# Patient Record
Sex: Male | Born: 1945 | Race: White | Hispanic: No | Marital: Married | Smoking: Never smoker
Health system: Southern US, Community
[De-identification: ages and names within clinical notes are randomized; demographics above are authoritative.]

## PROBLEM LIST (undated history)

## (undated) DIAGNOSIS — I10 Essential (primary) hypertension: Secondary | ICD-10-CM

---

## 2009-12-13 ENCOUNTER — Inpatient Hospital Stay: Payer: Self-pay | Admitting: Internal Medicine

## 2012-01-14 ENCOUNTER — Ambulatory Visit: Payer: Self-pay | Admitting: Surgery

## 2012-01-14 LAB — CBC WITH DIFFERENTIAL/PLATELET
Basophil #: 0.1 10*3/uL (ref 0.0–0.1)
Basophil %: 0.9 %
Eosinophil #: 0.2 10*3/uL (ref 0.0–0.7)
Eosinophil %: 2.8 %
HCT: 44.7 % (ref 40.0–52.0)
Lymphocyte #: 3.3 10*3/uL (ref 1.0–3.6)
Lymphocyte %: 43.4 %
MCHC: 34.7 g/dL (ref 32.0–36.0)
Monocyte %: 7.9 %
Neutrophil #: 3.4 10*3/uL (ref 1.4–6.5)
Platelet: 166 10*3/uL (ref 150–440)
RDW: 13.5 % (ref 11.5–14.5)
WBC: 7.5 10*3/uL (ref 3.8–10.6)

## 2012-01-14 LAB — BASIC METABOLIC PANEL
Chloride: 97 mmol/L — ABNORMAL LOW (ref 98–107)
Co2: 33 mmol/L — ABNORMAL HIGH (ref 21–32)
Creatinine: 1.35 mg/dL — ABNORMAL HIGH (ref 0.60–1.30)
EGFR (African American): >60
Potassium: 3.4 mmol/L — ABNORMAL LOW (ref 3.5–5.1)
Sodium: 137 mmol/L (ref 136–145)

## 2012-01-21 ENCOUNTER — Ambulatory Visit: Payer: Self-pay | Admitting: Surgery

## 2012-05-14 ENCOUNTER — Ambulatory Visit: Payer: Self-pay | Admitting: Orthopedic Surgery

## 2012-07-23 ENCOUNTER — Ambulatory Visit: Payer: Self-pay | Admitting: Surgery

## 2012-07-23 LAB — BASIC METABOLIC PANEL
Anion Gap: 4 — ABNORMAL LOW (ref 7–16)
BUN: 12 mg/dL (ref 7–18)
Calcium, Total: 9.1 mg/dL (ref 8.5–10.1)
EGFR (African American): >60
EGFR (Non-African Amer.): >60
Glucose: 153 mg/dL — ABNORMAL HIGH (ref 65–99)
Osmolality: 275 (ref 275–301)
Potassium: 3.2 mmol/L — ABNORMAL LOW (ref 3.5–5.1)
Sodium: 136 mmol/L (ref 136–145)

## 2012-07-23 LAB — CBC WITH DIFFERENTIAL/PLATELET
Basophil %: 0.7 %
Eosinophil #: 0.3 10*3/uL (ref 0.0–0.7)
HCT: 41.1 % (ref 40.0–52.0)
HGB: 14.2 g/dL (ref 13.0–18.0)
Lymphocyte #: 2.7 10*3/uL (ref 1.0–3.6)
MCH: 31.5 pg (ref 26.0–34.0)
MCHC: 34.5 g/dL (ref 32.0–36.0)
MCV: 91 fL (ref 80–100)
Monocyte #: 0.7 x10 3/mm (ref 0.2–1.0)
Monocyte %: 7.9 %
Neutrophil #: 5.5 10*3/uL (ref 1.4–6.5)
Platelet: 185 10*3/uL (ref 150–440)
RDW: 13.5 % (ref 11.5–14.5)

## 2012-07-30 ENCOUNTER — Ambulatory Visit: Payer: Self-pay | Admitting: Surgery

## 2013-07-20 DIAGNOSIS — H524 Presbyopia: Secondary | ICD-10-CM | POA: Diagnosis not present

## 2013-07-20 DIAGNOSIS — D3130 Benign neoplasm of unspecified choroid: Secondary | ICD-10-CM | POA: Diagnosis not present

## 2013-07-20 DIAGNOSIS — H35039 Hypertensive retinopathy, unspecified eye: Secondary | ICD-10-CM | POA: Diagnosis not present

## 2013-08-10 DIAGNOSIS — R002 Palpitations: Secondary | ICD-10-CM | POA: Diagnosis not present

## 2013-08-10 DIAGNOSIS — R0682 Tachypnea, not elsewhere classified: Secondary | ICD-10-CM | POA: Diagnosis not present

## 2013-09-23 DIAGNOSIS — E785 Hyperlipidemia, unspecified: Secondary | ICD-10-CM | POA: Diagnosis not present

## 2013-09-30 DIAGNOSIS — F329 Major depressive disorder, single episode, unspecified: Secondary | ICD-10-CM | POA: Diagnosis not present

## 2013-09-30 DIAGNOSIS — L719 Rosacea, unspecified: Secondary | ICD-10-CM | POA: Diagnosis not present

## 2013-09-30 DIAGNOSIS — I10 Essential (primary) hypertension: Secondary | ICD-10-CM | POA: Diagnosis not present

## 2013-09-30 DIAGNOSIS — E785 Hyperlipidemia, unspecified: Secondary | ICD-10-CM | POA: Diagnosis not present

## 2013-09-30 DIAGNOSIS — F3289 Other specified depressive episodes: Secondary | ICD-10-CM | POA: Diagnosis not present

## 2014-04-09 DIAGNOSIS — I10 Essential (primary) hypertension: Secondary | ICD-10-CM | POA: Diagnosis not present

## 2014-04-09 DIAGNOSIS — R05 Cough: Secondary | ICD-10-CM | POA: Diagnosis not present

## 2014-04-09 DIAGNOSIS — R42 Dizziness and giddiness: Secondary | ICD-10-CM | POA: Diagnosis not present

## 2014-04-09 DIAGNOSIS — E785 Hyperlipidemia, unspecified: Secondary | ICD-10-CM | POA: Diagnosis not present

## 2014-04-12 ENCOUNTER — Ambulatory Visit: Payer: Self-pay

## 2014-04-12 DIAGNOSIS — I6523 Occlusion and stenosis of bilateral carotid arteries: Secondary | ICD-10-CM | POA: Diagnosis not present

## 2014-04-12 DIAGNOSIS — R42 Dizziness and giddiness: Secondary | ICD-10-CM | POA: Diagnosis not present

## 2014-07-14 DIAGNOSIS — H35033 Hypertensive retinopathy, bilateral: Secondary | ICD-10-CM | POA: Diagnosis not present

## 2014-07-14 DIAGNOSIS — H2513 Age-related nuclear cataract, bilateral: Secondary | ICD-10-CM | POA: Diagnosis not present

## 2014-07-14 DIAGNOSIS — H524 Presbyopia: Secondary | ICD-10-CM | POA: Diagnosis not present

## 2014-08-12 DIAGNOSIS — Z8679 Personal history of other diseases of the circulatory system: Secondary | ICD-10-CM | POA: Diagnosis not present

## 2014-08-12 DIAGNOSIS — E785 Hyperlipidemia, unspecified: Secondary | ICD-10-CM | POA: Diagnosis not present

## 2014-08-12 DIAGNOSIS — F329 Major depressive disorder, single episode, unspecified: Secondary | ICD-10-CM | POA: Diagnosis not present

## 2014-08-12 DIAGNOSIS — I10 Essential (primary) hypertension: Secondary | ICD-10-CM | POA: Diagnosis not present

## 2014-08-20 NOTE — Op Note (Signed)
PATIENT NAME:  Anthony Estes, DESA MR#:  [id number] DATE OF BIRTH:  [birth year]  DATE OF OPERATION:  07/30/2012  PREOPERATIVE DIAGNOSIS: Recurrent right inguinal hernia.   POSTOPERATIVE DIAGNOSIS: Recurrent right inguinal hernia.   PROCEDURE: Laparoscopic transabdominal repair of a recurrent right inguinal hernia.   SURGEON: Phoebe Perch, MD  ASSISTANT: Addison Lank, PA-S.  INDICATIONS: This is a patient with a history of bilateral inguinal hernia repaired via TEP method at the [facility] last year. He had a recurrence on the left, which was repaired transabdominally by me, and now he has a recurrence on the right, with pain. No bulge.   Preoperatively, we discussed the rationale for surgery, the options of observation, the rationale for the transabdominal approach and the risks of bleeding, infection, recurrence, bowel injury and adhesions. This was all reviewed for him in the preop holding area again with his wife. They understood and agreed to proceed.   FINDINGS: Extensive adhesions on the left. Minimal adhesions around the periumbilical area. No bowel involved in the periumbilical adhesions. Some adhesions on the right lower quadrant not related to the groin, more related to the cecum. The appendix was identified and appeared completely normal on the right.   On the right there was no visible inguinal hernia recurrence until the peritoneum was taken down. Then it was evident that the mesh had migrated from medial to lateral, exposing the direct space and a small direct space hernia was noted there, with incarcerated preperitoneal fat. This was a very similar, if not identical, condition to what I identified on the left side last year at his previous operation.   DESCRIPTION OF PROCEDURE: The patient was induced to general anesthesia, given IV antibiotics. A Foley catheter was placed and VTE prophylaxis was in place. He was prepped and draped in a sterile fashion. Marcaine was infiltrated in  the skin and subcutaneous tissues around the periumbilical cord. An incision was made. A Veress needle was placed. Pneumoperitoneum was obtained and a 5 mm trocar port was placed. The abdominal cavity was explored. Under direct vision a suprapubic 5 mm port was placed and then under direct vision after identifying the adhesions, the 12 mm left lateral port was placed under direct vision, avoiding adhesions.   Adhesiolysis was performed, both in the right lower quadrant and in the periumbilical area, with sharp dissection with electrocautery. No bowel was identified within either of these adhesions. However, in the right lower quadrant the cecum was nearby. The appendix was identified at this time as well and found to be normal.   With adhesiolysis complete, attention was turned to the right lower quadrant where the previous tacks and mesh were identified in the preperitoneal space. The peritoneum was taken down around this sharply, and dissection was performed medially to identify Dvonte Gatliff's ligament, and the recurrence became evident, and it was clear that the mesh had mediated and folded medial to lateral. Dissection around the cord was performed and laterally as well.   A 5 x 5 C-Qur mesh was chosen as this was the mesh utilized previously on the recurrent left side. It was laterally scissored and placed into the preperitoneal space transabdominally, held in place with the [place] Surgical tacker, Jarrah Seher's ligament medially, and to the anterior abdominal wall. The tails of the scissored mesh were placed over and under the cord and held laterally with a single tack, avoiding the area of the nerve.   The re-peritonealization was attempted. The caudad to cephalad lifting of the previously  dissected peritoneum and bladder flap was performed to cover a large portion of the mesh, however a large portion of the C-Qur mesh was not covered and there was no evidence of  bowel coming into contact with this at the time of  closure, however there were adhesions already present which were noted in the pelvis to the peritoneum below the area of dissection.   The patient was taken out of Trendelenburg position and the re-peritonealization seemed to hold its position. The left lateral port site was closed utilizing an EndoClose technique with 0 Vicryl simple sutures, and then again hemostasis was found to be adequate. The hernia repair appeared to be adequate, therefore pneumoperitoneum was released. All ports were removed; 4-0 subcuticular Monocryl was used on all skin edges. Steri-Strips, Mastisol, and sterile dressings were placed.   The patient tolerated the procedure well. There were no complications. He was taken to the recovery room in stable condition to be discharged in the care of his family. He will follow up in 10 days.   ____________________________ Jerrol Banana Burt Knack, MD rec:dm D: 07/30/2012 08:42:00 ET T: 07/30/2012 08:51:38 ET JOB#: [id number]  cc: Jerrol Banana. Burt Knack, MD, <Dictator> Florene Glen MD ELECTRONICALLY SIGNED 07/30/2012 15:05

## 2015-02-28 DIAGNOSIS — M722 Plantar fascial fibromatosis: Secondary | ICD-10-CM | POA: Diagnosis not present

## 2015-04-01 DIAGNOSIS — K649 Unspecified hemorrhoids: Secondary | ICD-10-CM | POA: Diagnosis not present

## 2015-04-29 DIAGNOSIS — L290 Pruritus ani: Secondary | ICD-10-CM | POA: Diagnosis not present

## 2015-04-29 DIAGNOSIS — K648 Other hemorrhoids: Secondary | ICD-10-CM | POA: Diagnosis not present

## 2015-06-08 DIAGNOSIS — K648 Other hemorrhoids: Secondary | ICD-10-CM | POA: Diagnosis not present

## 2015-07-06 DIAGNOSIS — K648 Other hemorrhoids: Secondary | ICD-10-CM | POA: Diagnosis not present

## 2015-07-12 DIAGNOSIS — H00025 Hordeolum internum left lower eyelid: Secondary | ICD-10-CM | POA: Diagnosis not present

## 2015-07-12 DIAGNOSIS — H35033 Hypertensive retinopathy, bilateral: Secondary | ICD-10-CM | POA: Diagnosis not present

## 2015-07-12 DIAGNOSIS — H524 Presbyopia: Secondary | ICD-10-CM | POA: Diagnosis not present

## 2015-07-12 DIAGNOSIS — H00022 Hordeolum internum right lower eyelid: Secondary | ICD-10-CM | POA: Diagnosis not present

## 2015-07-12 DIAGNOSIS — H2513 Age-related nuclear cataract, bilateral: Secondary | ICD-10-CM | POA: Diagnosis not present

## 2015-07-12 DIAGNOSIS — I10 Essential (primary) hypertension: Secondary | ICD-10-CM | POA: Diagnosis not present

## 2015-08-16 DIAGNOSIS — M1990 Unspecified osteoarthritis, unspecified site: Secondary | ICD-10-CM | POA: Diagnosis not present

## 2015-08-16 DIAGNOSIS — F4310 Post-traumatic stress disorder, unspecified: Secondary | ICD-10-CM | POA: Diagnosis not present

## 2015-08-16 DIAGNOSIS — I10 Essential (primary) hypertension: Secondary | ICD-10-CM | POA: Diagnosis not present

## 2015-08-16 DIAGNOSIS — E784 Other hyperlipidemia: Secondary | ICD-10-CM | POA: Diagnosis not present

## 2015-08-19 DIAGNOSIS — L821 Other seborrheic keratosis: Secondary | ICD-10-CM | POA: Diagnosis not present

## 2015-08-19 DIAGNOSIS — L301 Dyshidrosis [pompholyx]: Secondary | ICD-10-CM | POA: Diagnosis not present

## 2015-12-30 DIAGNOSIS — L298 Other pruritus: Secondary | ICD-10-CM | POA: Diagnosis not present

## 2015-12-30 DIAGNOSIS — L821 Other seborrheic keratosis: Secondary | ICD-10-CM | POA: Diagnosis not present

## 2015-12-30 DIAGNOSIS — L820 Inflamed seborrheic keratosis: Secondary | ICD-10-CM | POA: Diagnosis not present

## 2015-12-30 DIAGNOSIS — L538 Other specified erythematous conditions: Secondary | ICD-10-CM | POA: Diagnosis not present

## 2016-07-11 DIAGNOSIS — H01025 Squamous blepharitis left lower eyelid: Secondary | ICD-10-CM | POA: Diagnosis not present

## 2016-07-11 DIAGNOSIS — H35033 Hypertensive retinopathy, bilateral: Secondary | ICD-10-CM | POA: Diagnosis not present

## 2016-07-11 DIAGNOSIS — H01022 Squamous blepharitis right lower eyelid: Secondary | ICD-10-CM | POA: Diagnosis not present

## 2016-07-11 DIAGNOSIS — H524 Presbyopia: Secondary | ICD-10-CM | POA: Diagnosis not present

## 2016-07-11 DIAGNOSIS — I10 Essential (primary) hypertension: Secondary | ICD-10-CM | POA: Diagnosis not present

## 2016-07-11 DIAGNOSIS — H2513 Age-related nuclear cataract, bilateral: Secondary | ICD-10-CM | POA: Diagnosis not present

## 2016-08-01 DIAGNOSIS — L570 Actinic keratosis: Secondary | ICD-10-CM | POA: Diagnosis not present

## 2016-08-01 DIAGNOSIS — L853 Xerosis cutis: Secondary | ICD-10-CM | POA: Diagnosis not present

## 2016-08-01 DIAGNOSIS — L300 Nummular dermatitis: Secondary | ICD-10-CM | POA: Diagnosis not present

## 2016-08-01 DIAGNOSIS — R202 Paresthesia of skin: Secondary | ICD-10-CM | POA: Diagnosis not present

## 2016-08-01 DIAGNOSIS — L821 Other seborrheic keratosis: Secondary | ICD-10-CM | POA: Diagnosis not present

## 2016-08-01 DIAGNOSIS — X32XXXA Exposure to sunlight, initial encounter: Secondary | ICD-10-CM | POA: Diagnosis not present

## 2017-07-18 DIAGNOSIS — H524 Presbyopia: Secondary | ICD-10-CM | POA: Diagnosis not present

## 2017-07-18 DIAGNOSIS — H2513 Age-related nuclear cataract, bilateral: Secondary | ICD-10-CM | POA: Diagnosis not present

## 2017-07-18 DIAGNOSIS — H52223 Regular astigmatism, bilateral: Secondary | ICD-10-CM | POA: Diagnosis not present

## 2017-07-18 DIAGNOSIS — H5203 Hypermetropia, bilateral: Secondary | ICD-10-CM | POA: Diagnosis not present

## 2017-07-31 DIAGNOSIS — X32XXXA Exposure to sunlight, initial encounter: Secondary | ICD-10-CM | POA: Diagnosis not present

## 2017-07-31 DIAGNOSIS — D0439 Carcinoma in situ of skin of other parts of face: Secondary | ICD-10-CM | POA: Diagnosis not present

## 2017-07-31 DIAGNOSIS — L570 Actinic keratosis: Secondary | ICD-10-CM | POA: Diagnosis not present

## 2017-07-31 DIAGNOSIS — D485 Neoplasm of uncertain behavior of skin: Secondary | ICD-10-CM | POA: Diagnosis not present

## 2017-08-22 DIAGNOSIS — D0439 Carcinoma in situ of skin of other parts of face: Secondary | ICD-10-CM | POA: Diagnosis not present

## 2017-08-22 DIAGNOSIS — L905 Scar conditions and fibrosis of skin: Secondary | ICD-10-CM | POA: Diagnosis not present

## 2017-10-12 DIAGNOSIS — M9903 Segmental and somatic dysfunction of lumbar region: Secondary | ICD-10-CM | POA: Diagnosis not present

## 2017-10-12 DIAGNOSIS — M4306 Spondylolysis, lumbar region: Secondary | ICD-10-CM | POA: Diagnosis not present

## 2017-10-12 DIAGNOSIS — M955 Acquired deformity of pelvis: Secondary | ICD-10-CM | POA: Diagnosis not present

## 2017-10-12 DIAGNOSIS — M9905 Segmental and somatic dysfunction of pelvic region: Secondary | ICD-10-CM | POA: Diagnosis not present

## 2017-10-15 DIAGNOSIS — M9905 Segmental and somatic dysfunction of pelvic region: Secondary | ICD-10-CM | POA: Diagnosis not present

## 2017-10-15 DIAGNOSIS — M9903 Segmental and somatic dysfunction of lumbar region: Secondary | ICD-10-CM | POA: Diagnosis not present

## 2017-10-15 DIAGNOSIS — M955 Acquired deformity of pelvis: Secondary | ICD-10-CM | POA: Diagnosis not present

## 2017-10-15 DIAGNOSIS — M4306 Spondylolysis, lumbar region: Secondary | ICD-10-CM | POA: Diagnosis not present

## 2017-10-16 DIAGNOSIS — M4306 Spondylolysis, lumbar region: Secondary | ICD-10-CM | POA: Diagnosis not present

## 2017-10-16 DIAGNOSIS — M9903 Segmental and somatic dysfunction of lumbar region: Secondary | ICD-10-CM | POA: Diagnosis not present

## 2017-10-16 DIAGNOSIS — M955 Acquired deformity of pelvis: Secondary | ICD-10-CM | POA: Diagnosis not present

## 2017-10-16 DIAGNOSIS — M9905 Segmental and somatic dysfunction of pelvic region: Secondary | ICD-10-CM | POA: Diagnosis not present

## 2017-10-17 DIAGNOSIS — M4306 Spondylolysis, lumbar region: Secondary | ICD-10-CM | POA: Diagnosis not present

## 2017-10-17 DIAGNOSIS — M9905 Segmental and somatic dysfunction of pelvic region: Secondary | ICD-10-CM | POA: Diagnosis not present

## 2017-10-17 DIAGNOSIS — M9903 Segmental and somatic dysfunction of lumbar region: Secondary | ICD-10-CM | POA: Diagnosis not present

## 2017-10-17 DIAGNOSIS — M955 Acquired deformity of pelvis: Secondary | ICD-10-CM | POA: Diagnosis not present

## 2017-10-21 DIAGNOSIS — M9905 Segmental and somatic dysfunction of pelvic region: Secondary | ICD-10-CM | POA: Diagnosis not present

## 2017-10-21 DIAGNOSIS — M4306 Spondylolysis, lumbar region: Secondary | ICD-10-CM | POA: Diagnosis not present

## 2017-10-21 DIAGNOSIS — M9903 Segmental and somatic dysfunction of lumbar region: Secondary | ICD-10-CM | POA: Diagnosis not present

## 2017-10-21 DIAGNOSIS — M955 Acquired deformity of pelvis: Secondary | ICD-10-CM | POA: Diagnosis not present

## 2017-10-23 DIAGNOSIS — M9903 Segmental and somatic dysfunction of lumbar region: Secondary | ICD-10-CM | POA: Diagnosis not present

## 2017-10-23 DIAGNOSIS — M955 Acquired deformity of pelvis: Secondary | ICD-10-CM | POA: Diagnosis not present

## 2017-10-23 DIAGNOSIS — M4306 Spondylolysis, lumbar region: Secondary | ICD-10-CM | POA: Diagnosis not present

## 2017-10-23 DIAGNOSIS — M9905 Segmental and somatic dysfunction of pelvic region: Secondary | ICD-10-CM | POA: Diagnosis not present

## 2017-10-24 DIAGNOSIS — L290 Pruritus ani: Secondary | ICD-10-CM | POA: Diagnosis not present

## 2017-10-24 DIAGNOSIS — M9905 Segmental and somatic dysfunction of pelvic region: Secondary | ICD-10-CM | POA: Diagnosis not present

## 2017-10-24 DIAGNOSIS — M4306 Spondylolysis, lumbar region: Secondary | ICD-10-CM | POA: Diagnosis not present

## 2017-10-24 DIAGNOSIS — M9903 Segmental and somatic dysfunction of lumbar region: Secondary | ICD-10-CM | POA: Diagnosis not present

## 2017-10-24 DIAGNOSIS — M955 Acquired deformity of pelvis: Secondary | ICD-10-CM | POA: Diagnosis not present

## 2017-10-28 DIAGNOSIS — M955 Acquired deformity of pelvis: Secondary | ICD-10-CM | POA: Diagnosis not present

## 2017-10-28 DIAGNOSIS — M9903 Segmental and somatic dysfunction of lumbar region: Secondary | ICD-10-CM | POA: Diagnosis not present

## 2017-10-28 DIAGNOSIS — M4306 Spondylolysis, lumbar region: Secondary | ICD-10-CM | POA: Diagnosis not present

## 2017-10-28 DIAGNOSIS — M9905 Segmental and somatic dysfunction of pelvic region: Secondary | ICD-10-CM | POA: Diagnosis not present

## 2017-11-04 DIAGNOSIS — M4306 Spondylolysis, lumbar region: Secondary | ICD-10-CM | POA: Diagnosis not present

## 2017-11-04 DIAGNOSIS — M9905 Segmental and somatic dysfunction of pelvic region: Secondary | ICD-10-CM | POA: Diagnosis not present

## 2017-11-04 DIAGNOSIS — M955 Acquired deformity of pelvis: Secondary | ICD-10-CM | POA: Diagnosis not present

## 2017-11-04 DIAGNOSIS — M9903 Segmental and somatic dysfunction of lumbar region: Secondary | ICD-10-CM | POA: Diagnosis not present

## 2017-11-11 DIAGNOSIS — M4306 Spondylolysis, lumbar region: Secondary | ICD-10-CM | POA: Diagnosis not present

## 2017-11-11 DIAGNOSIS — M9903 Segmental and somatic dysfunction of lumbar region: Secondary | ICD-10-CM | POA: Diagnosis not present

## 2017-11-11 DIAGNOSIS — M9905 Segmental and somatic dysfunction of pelvic region: Secondary | ICD-10-CM | POA: Diagnosis not present

## 2017-11-11 DIAGNOSIS — M955 Acquired deformity of pelvis: Secondary | ICD-10-CM | POA: Diagnosis not present

## 2017-11-21 DIAGNOSIS — L290 Pruritus ani: Secondary | ICD-10-CM | POA: Diagnosis not present

## 2017-11-25 DIAGNOSIS — M955 Acquired deformity of pelvis: Secondary | ICD-10-CM | POA: Diagnosis not present

## 2017-11-25 DIAGNOSIS — M4306 Spondylolysis, lumbar region: Secondary | ICD-10-CM | POA: Diagnosis not present

## 2017-11-25 DIAGNOSIS — M9905 Segmental and somatic dysfunction of pelvic region: Secondary | ICD-10-CM | POA: Diagnosis not present

## 2017-11-25 DIAGNOSIS — M9903 Segmental and somatic dysfunction of lumbar region: Secondary | ICD-10-CM | POA: Diagnosis not present

## 2017-12-11 DIAGNOSIS — X32XXXA Exposure to sunlight, initial encounter: Secondary | ICD-10-CM | POA: Diagnosis not present

## 2017-12-11 DIAGNOSIS — L821 Other seborrheic keratosis: Secondary | ICD-10-CM | POA: Diagnosis not present

## 2017-12-11 DIAGNOSIS — L570 Actinic keratosis: Secondary | ICD-10-CM | POA: Diagnosis not present

## 2017-12-11 DIAGNOSIS — Z85828 Personal history of other malignant neoplasm of skin: Secondary | ICD-10-CM | POA: Diagnosis not present

## 2017-12-11 DIAGNOSIS — Z08 Encounter for follow-up examination after completed treatment for malignant neoplasm: Secondary | ICD-10-CM | POA: Diagnosis not present

## 2017-12-31 DIAGNOSIS — M955 Acquired deformity of pelvis: Secondary | ICD-10-CM | POA: Diagnosis not present

## 2017-12-31 DIAGNOSIS — M9905 Segmental and somatic dysfunction of pelvic region: Secondary | ICD-10-CM | POA: Diagnosis not present

## 2017-12-31 DIAGNOSIS — M4306 Spondylolysis, lumbar region: Secondary | ICD-10-CM | POA: Diagnosis not present

## 2017-12-31 DIAGNOSIS — M9903 Segmental and somatic dysfunction of lumbar region: Secondary | ICD-10-CM | POA: Diagnosis not present

## 2018-02-25 DIAGNOSIS — M9903 Segmental and somatic dysfunction of lumbar region: Secondary | ICD-10-CM | POA: Diagnosis not present

## 2018-02-25 DIAGNOSIS — M9905 Segmental and somatic dysfunction of pelvic region: Secondary | ICD-10-CM | POA: Diagnosis not present

## 2018-02-25 DIAGNOSIS — M4306 Spondylolysis, lumbar region: Secondary | ICD-10-CM | POA: Diagnosis not present

## 2018-02-25 DIAGNOSIS — M955 Acquired deformity of pelvis: Secondary | ICD-10-CM | POA: Diagnosis not present

## 2019-01-13 ENCOUNTER — Other Ambulatory Visit: Payer: Self-pay | Admitting: Infectious Diseases

## 2019-01-13 DIAGNOSIS — E7849 Other hyperlipidemia: Secondary | ICD-10-CM

## 2019-01-13 DIAGNOSIS — N39490 Overflow incontinence: Secondary | ICD-10-CM

## 2019-01-13 DIAGNOSIS — M545 Low back pain, unspecified: Secondary | ICD-10-CM

## 2019-01-26 ENCOUNTER — Other Ambulatory Visit: Payer: Self-pay

## 2019-01-26 ENCOUNTER — Ambulatory Visit
Admission: RE | Admit: 2019-01-26 | Discharge: 2019-01-26 | Disposition: A | Discharge status: Home / Self Care | Payer: Medicare Other | Source: Ambulatory Visit | Attending: Infectious Diseases | Admitting: Infectious Diseases

## 2019-01-26 DIAGNOSIS — N39490 Overflow incontinence: Secondary | ICD-10-CM | POA: Diagnosis present

## 2019-01-26 DIAGNOSIS — M545 Low back pain, unspecified: Secondary | ICD-10-CM

## 2019-01-26 DIAGNOSIS — E7849 Other hyperlipidemia: Secondary | ICD-10-CM | POA: Insufficient documentation

## 2019-01-26 MED ORDER — GADOBUTROL 1 MMOL/ML IV SOLN
6.0000 mL | Freq: Once | INTRAVENOUS | Status: AC | PRN
  Administered 2019-01-26: 16:00:00 6 mL via INTRAVENOUS

## 2019-09-02 ENCOUNTER — Observation Stay
Admission: EM | Admit: 2019-09-02 | Discharge: 2019-09-03 | Disposition: A | Discharge status: Home / Self Care | Payer: Medicare Other | Attending: Internal Medicine | Admitting: Internal Medicine

## 2019-09-02 ENCOUNTER — Encounter: Payer: Self-pay | Admitting: Emergency Medicine

## 2019-09-02 ENCOUNTER — Other Ambulatory Visit: Payer: Self-pay

## 2019-09-02 ENCOUNTER — Emergency Department: Payer: Medicare Other

## 2019-09-02 DIAGNOSIS — T797XXA Traumatic subcutaneous emphysema, initial encounter: Principal | ICD-10-CM

## 2019-09-02 DIAGNOSIS — Z79899 Other long term (current) drug therapy: Secondary | ICD-10-CM | POA: Insufficient documentation

## 2019-09-02 DIAGNOSIS — E876 Hypokalemia: Secondary | ICD-10-CM | POA: Insufficient documentation

## 2019-09-02 DIAGNOSIS — I10 Essential (primary) hypertension: Secondary | ICD-10-CM | POA: Insufficient documentation

## 2019-09-02 DIAGNOSIS — T8182XA Emphysema (subcutaneous) resulting from a procedure, initial encounter: Secondary | ICD-10-CM | POA: Insufficient documentation

## 2019-09-02 DIAGNOSIS — Z20822 Contact with and (suspected) exposure to covid-19: Secondary | ICD-10-CM | POA: Diagnosis not present

## 2019-09-02 DIAGNOSIS — Y838 Other surgical procedures as the cause of abnormal reaction of the patient, or of later complication, without mention of misadventure at the time of the procedure: Secondary | ICD-10-CM | POA: Diagnosis not present

## 2019-09-02 DIAGNOSIS — R220 Localized swelling, mass and lump, head: Secondary | ICD-10-CM

## 2019-09-02 HISTORY — DX: Essential (primary) hypertension: I10

## 2019-09-02 LAB — CBC WITH DIFFERENTIAL/PLATELET
Abs Immature Granulocytes: 0.02 10*3/uL (ref 0.00–0.07)
Basophils Absolute: 0.1 10*3/uL (ref 0.0–0.1)
Basophils Relative: 1 %
Eosinophils Absolute: 0.3 10*3/uL (ref 0.0–0.5)
Eosinophils Relative: 4 %
HCT: 42.3 % (ref 39.0–52.0)
Hemoglobin: 14.9 g/dL (ref 13.0–17.0)
Immature Granulocytes: 0 %
Lymphocytes Relative: 51 %
Lymphs Abs: 3.7 10*3/uL (ref 0.7–4.0)
MCH: 32.1 pg (ref 26.0–34.0)
MCHC: 35.2 g/dL (ref 30.0–36.0)
MCV: 91.2 fL (ref 80.0–100.0)
Monocytes Absolute: 0.8 10*3/uL (ref 0.1–1.0)
Monocytes Relative: 10 %
Neutro Abs: 2.6 10*3/uL (ref 1.7–7.7)
Neutrophils Relative %: 34 %
Platelets: 157 10*3/uL (ref 150–400)
RBC: 4.64 MIL/uL (ref 4.22–5.81)
RDW: 13 % (ref 11.5–15.5)
WBC: 7.4 10*3/uL (ref 4.0–10.5)
nRBC: 0 % (ref 0.0–0.2)

## 2019-09-02 LAB — BASIC METABOLIC PANEL
Anion gap: 10 (ref 5–15)
BUN: 17 mg/dL (ref 8–23)
CO2: 29 mmol/L (ref 22–32)
Calcium: 9.2 mg/dL (ref 8.9–10.3)
Chloride: 96 mmol/L — ABNORMAL LOW (ref 98–111)
Creatinine, Ser: 1.31 mg/dL — ABNORMAL HIGH (ref 0.61–1.24)
GFR calc Af Amer: >60 mL/min (ref >60)
GFR calc non Af Amer: 54 mL/min — ABNORMAL LOW (ref >60)
Glucose, Bld: 159 mg/dL — ABNORMAL HIGH (ref 70–99)
Potassium: 3.1 mmol/L — ABNORMAL LOW (ref 3.5–5.1)
Sodium: 135 mmol/L (ref 135–145)

## 2019-09-02 LAB — RESPIRATORY PANEL BY RT PCR (FLU A&B, COVID)
Influenza A by PCR: NEGATIVE
Influenza B by PCR: NEGATIVE
SARS Coronavirus 2 by RT PCR: NEGATIVE

## 2019-09-02 LAB — HEMOGLOBIN A1C
Hgb A1c MFr Bld: 5.5 % (ref 4.8–5.6)
Mean Plasma Glucose: 111.15 mg/dL

## 2019-09-02 LAB — MAGNESIUM: Magnesium: 2.2 mg/dL (ref 1.7–2.4)

## 2019-09-02 MED ORDER — TRAZODONE HCL 50 MG PO TABS
50.0000 mg | ORAL_TABLET | Freq: Every day | ORAL | Status: DC
  Administered 2019-09-02: 50 mg via ORAL
  Filled 2019-09-02: qty 1

## 2019-09-02 MED ORDER — ENOXAPARIN SODIUM 40 MG/0.4ML ~~LOC~~ SOLN
40.0000 mg | SUBCUTANEOUS | Status: DC
  Administered 2019-09-02: 40 mg via SUBCUTANEOUS
  Filled 2019-09-02: qty 0.4

## 2019-09-02 MED ORDER — SIMVASTATIN 20 MG PO TABS
20.0000 mg | ORAL_TABLET | Freq: Every day | ORAL | Status: DC
  Administered 2019-09-02: 20 mg via ORAL
  Filled 2019-09-02: qty 1

## 2019-09-02 MED ORDER — SODIUM CHLORIDE 0.9 % IV SOLN
3.0000 g | Freq: Once | INTRAVENOUS | Status: AC
  Administered 2019-09-02: 3 g via INTRAVENOUS
  Filled 2019-09-02: qty 8

## 2019-09-02 MED ORDER — ACETAMINOPHEN 650 MG RE SUPP: 650.0000 mg | Freq: Four times a day (QID) | RECTAL | Status: DC | PRN

## 2019-09-02 MED ORDER — METOPROLOL TARTRATE 25 MG PO TABS
25.0000 mg | ORAL_TABLET | Freq: Two times a day (BID) | ORAL | Status: DC
  Administered 2019-09-02 – 2019-09-03 (×2): 25 mg via ORAL
  Filled 2019-09-02 (×2): qty 1

## 2019-09-02 MED ORDER — IOHEXOL 300 MG/ML  SOLN
75.0000 mL | Freq: Once | INTRAMUSCULAR | Status: AC | PRN
  Administered 2019-09-02: 75 mL via INTRAVENOUS

## 2019-09-02 MED ORDER — HYDROCHLOROTHIAZIDE 25 MG PO TABS
25.0000 mg | ORAL_TABLET | Freq: Every day | ORAL | Status: DC
  Administered 2019-09-03: 25 mg via ORAL
  Filled 2019-09-02: qty 1

## 2019-09-02 MED ORDER — POTASSIUM CHLORIDE CRYS ER 20 MEQ PO TBCR
40.0000 meq | EXTENDED_RELEASE_TABLET | Freq: Once | ORAL | Status: AC
  Administered 2019-09-02: 40 meq via ORAL
  Filled 2019-09-02: qty 2

## 2019-09-02 MED ORDER — DOCUSATE SODIUM 100 MG PO CAPS
100.0000 mg | ORAL_CAPSULE | Freq: Every day | ORAL | Status: DC
  Filled 2019-09-02: qty 1

## 2019-09-02 MED ORDER — ACETAMINOPHEN 325 MG PO TABS: 650.0000 mg | ORAL_TABLET | Freq: Four times a day (QID) | ORAL | Status: DC | PRN

## 2019-09-02 MED ORDER — AMOXICILLIN-POT CLAVULANATE 875-125 MG PO TABS
1.0000 | ORAL_TABLET | Freq: Two times a day (BID) | ORAL | Status: DC
  Administered 2019-09-02 – 2019-09-03 (×2): 1 via ORAL
  Filled 2019-09-02 (×2): qty 1

## 2019-09-02 NOTE — ED Notes (Signed)
Pt given cup of water approved per EDP. Pt tolerated sipping water well.

## 2019-09-02 NOTE — ED Notes (Signed)
FIRST NURSE: Pt driven in by dentist after noted swelling to face from septacaine injection.  Pt was given PO benadryl at office.

## 2019-09-02 NOTE — ED Notes (Signed)
Lt green and lavender tubes sent to lab 

## 2019-09-02 NOTE — Consult Note (Signed)
Anthony Estes, Anthony Estes QP:[id number] Jan 22, 1946 Riley Nearing, MD  Reason for Consult: Subcutaneous emphysema Requesting Physician: Lorella Nimrod, MD Consulting Physician: Riley Nearing, MD  HPI: This 74 y.o. year old male was admitted on 09/02/2019 for Facial swelling [R22.0] Subcutaneous emphysema, initial encounter (Viola) [T79.7XXA] Subcutaneous emphysema after procedure [T81.82XA]. Patient was having work done on a dental implant left mandible today. Had local injected and then procedure was started but developed rapid swelling left face, so was stopped. Apparently an air-gun type device was used during the procedure. Initially was thought to be an allergic reaction and benadryl was given, and he was sent to the ER. Noted here to have swelling of the face and neck, worse on the left. CT indicated extensive subcutaneous emphysema extending into the mediastinum. Fortunately he did not have any breathing issues. I recommended admission for observation and IV antibiotics to prophylax against the risk of deep tissue infection and mediastinitis.   Medications:  Current Facility-Administered Medications  Medication Dose Route Frequency Provider Last Rate Last Admin  . acetaminophen (TYLENOL) tablet 650 mg  650 mg Oral Q6H PRN Lorella Nimrod, MD       Or  . acetaminophen (TYLENOL) suppository 650 mg  650 mg Rectal Q6H PRN Lorella Nimrod, MD      . amoxicillin-clavulanate (AUGMENTIN) 875-125 MG per tablet 1 tablet  1 tablet Oral Q12H Lorella Nimrod, MD      . docusate sodium (COLACE) capsule 100 mg  100 mg Oral QHS Amin, Soundra Pilon, MD      . enoxaparin (LOVENOX) injection 40 mg  40 mg Subcutaneous Q24H Lorella Nimrod, MD      . hydrochlorothiazide (HYDRODIURIL) tablet 25 mg  25 mg Oral Daily Lorella Nimrod, MD      . metoprolol tartrate (LOPRESSOR) tablet 25 mg  25 mg Oral BID Lorella Nimrod, MD      . simvastatin (ZOCOR) tablet 20 mg  20 mg Oral QHS Lorella Nimrod, MD      .  Medications Prior to Admission   Medication Sig Dispense Refill  . docusate sodium (COLACE) 100 MG capsule Take 100 mg by mouth at bedtime.     Marland Kitchen doxycycline (MONODOX) 50 MG capsule Take 50 mg by mouth daily.    . hydrochlorothiazide (HYDRODIURIL) 25 MG tablet Take 25 mg by mouth daily.    . metoprolol tartrate (LOPRESSOR) 25 MG tablet Take 25 mg by mouth 2 (two) times daily.     . Multiple Vitamin (MULTIVITAMIN WITH MINERALS) TABS tablet Take 1 tablet by mouth daily.    Marland Kitchen omega-3 acid ethyl esters (LOVAZA) 1 g capsule Take 1 g by mouth 2 (two) times daily.    . simvastatin (ZOCOR) 40 MG tablet Take 20 mg by mouth at bedtime.       Allergies:  Allergies  Allergen Reactions  . Tacrolimus Other (See Comments)  . Other Swelling and Rash    Septocaine with Epi 1:100,000 - caused facial swelling and rash    PMH:  Past Medical History:  Diagnosis Date  . Hypertension     Fam Hx: History reviewed. No pertinent family history.  Soc Hx:  Social History   Socioeconomic History  . Marital status: Married    Spouse name: Not on file  . Number of children: Not on file  . Years of education: Not on file  . Highest education level: Not on file  Occupational History  . Not on file  Tobacco Use  . Smoking status: Not on file  Substance and Sexual Activity  . Alcohol use: Not on file  . Drug use: Not on file  . Sexual activity: Not on file  Other Topics Concern  . Not on file  Social History Narrative  . Not on file   Social Determinants of Health   Financial Resource Strain:   . Difficulty of Paying Living Expenses:   Food Insecurity:   . Worried About Charity fundraiser in the Last Year:   . Arboriculturist in the Last Year:   Transportation Needs:   . Film/video editor (Medical):   Marland Kitchen Lack of Transportation (Non-Medical):   Physical Activity:   . Days of Exercise per Week:   . Minutes of Exercise per Session:   Stress:   . Feeling of Stress :   Social Connections:   . Frequency of  Communication with Friends and Family:   . Frequency of Social Gatherings with Friends and Family:   . Attends Religious Services:   . Active Member of Clubs or Organizations:   . Attends Archivist Meetings:   Marland Kitchen Marital Status:   Intimate Partner Violence:   . Fear of Current or Ex-Partner:   . Emotionally Abused:   Marland Kitchen Physically Abused:   . Sexually Abused:     PSH: History reviewed. No pertinent surgical history.. Procedures since admission: No admission procedures for hospital encounter.  ROS: Review of systems normal other than 12 systems except per HPI.  PHYSICAL EXAM  Vitals: Blood pressure (!) 154/84, pulse 75, temperature 97.9 F (36.6 C), temperature source Oral, resp. rate 18, height 5\' 6"  (1.676 m), weight 70 kg, SpO2 99 %.. General: Well-developed, Well-nourished in no acute distress Mood: Mood and affect well adjusted, pleasant and cooperative. Orientation: Grossly alert and oriented. Vocal Quality: No hoarseness. Communicates verbally. head and Face: NCAT. Left face puffy with palpable subcutaneous air, mildly tender. No visible skin lesions. No significant facial scars. No tenderness with sinus percussion. Facial strength normal and symmetric. Ears: External ears with normal landmarks, no lesions. External auditory canals free of infection, cerumen impaction or lesions. Tympanic membranes intact with good landmarks and normal mobility on pneumatic otoscopy. No middle ear effusion. Hearing: Speech reception grossly normal. Nose: External nose normal with midline dorsum and no lesions or deformity. Nasal Cavity reveals essentially midline septum with normal inferior turbinates. No significant mucosal congestion or erythema. Nasal secretions are minimal and clear. No polyps seen on anterior rhinoscopy. Oral Cavity/ Oropharynx: Lips are normal with no lesions. Teeth no frank dental caries. Gingiva appears traumatized slightly around the dental implant. Oropharynx  including tongue, buccal mucosa, floor of mouth, hard and soft palate, uvula and posterior pharynx free of exudates, erythema or lesions with normal symmetry and hydration.  Indirect Laryngoscopy/Nasopharyngoscopy: Visualization of the larynx, hypopharynx and nasopharynx is not possible in this setting with routine examination. Neck: Supple palpable crepitance from subcutaneous air, mildly tender. The trachea is midline. Thyroid gland is soft, nontender and symmetric with no masses or enlargement. Parotid and submandibular glands are soft, nontender and symmetric, without masses. Lymphatic: Cervical lymph nodes are without palpable lymphadenopathy or tenderness. Respiratory: Normal respiratory effort without labored breathing. Cardiovascular: Carotid pulse shows regular rate and rhythm Neurologic: Cranial Nerves II through XII are grossly intact. Eyes: Gaze and Ocular Motility are grossly normal. PERRLA. No visible nystagmus.  MEDICAL DECISION MAKING: Data Review:  Results for orders placed or performed during the hospital encounter of 09/02/19 (from the past 48 hour(s))  Basic  metabolic panel     Status: Abnormal   Collection Time: 09/02/19 11:56 AM  Result Value Ref Range   Sodium 135 135 - 145 mmol/L   Potassium 3.1 (L) 3.5 - 5.1 mmol/L   Chloride 96 (L) 98 - 111 mmol/L   CO2 29 22 - 32 mmol/L   Glucose, Bld 159 (H) 70 - 99 mg/dL    Comment: Glucose reference range applies only to samples taken after fasting for at least 8 hours.   BUN 17 8 - 23 mg/dL   Creatinine, Ser 1.31 (H) 0.61 - 1.24 mg/dL   Calcium 9.2 8.9 - 10.3 mg/dL   GFR calc non Af Amer 54 (L) >60 mL/min   GFR calc Af Amer >60 >60 mL/min   Anion gap 10 5 - 15    Comment: Performed at [facility], [address] [place], [place] [address]  CBC with Differential     Status: None   Collection Time: 09/02/19 11:56 AM  Result Value Ref Range   WBC 7.4 4.0 - 10.5 K/uL   RBC 4.64 4.22 - 5.81 MIL/uL   Hemoglobin  14.9 13.0 - 17.0 g/dL   HCT 42.3 39.0 - 52.0 %   MCV 91.2 80.0 - 100.0 fL   MCH 32.1 26.0 - 34.0 pg   MCHC 35.2 30.0 - 36.0 g/dL   RDW 13.0 11.5 - 15.5 %   Platelets 157 150 - 400 K/uL   nRBC 0.0 0.0 - 0.2 %   Neutrophils Relative % 34 %   Neutro Abs 2.6 1.7 - 7.7 K/uL   Lymphocytes Relative 51 %   Lymphs Abs 3.7 0.7 - 4.0 K/uL   Monocytes Relative 10 %   Monocytes Absolute 0.8 0.1 - 1.0 K/uL   Eosinophils Relative 4 %   Eosinophils Absolute 0.3 0.0 - 0.5 K/uL   Basophils Relative 1 %   Basophils Absolute 0.1 0.0 - 0.1 K/uL   Immature Granulocytes 0 %   Abs Immature Granulocytes 0.02 0.00 - 0.07 K/uL    Comment: Performed at [facility], [address], [place], [place] [address]  Magnesium     Status: None   Collection Time: 09/02/19 11:56 AM  Result Value Ref Range   Magnesium 2.2 1.7 - 2.4 mg/dL    Comment: Performed at [facility], [address] [place], [place] [address]  Respiratory Panel by RT PCR (Flu A&B, Covid) - Nasopharyngeal Swab     Status: None   Collection Time: 09/02/19  1:49 PM   Specimen: Nasopharyngeal Swab  Result Value Ref Range   SARS Coronavirus 2 by RT PCR NEGATIVE NEGATIVE    Comment: (NOTE) SARS-CoV-2 target nucleic acids are NOT DETECTED. The SARS-CoV-2 RNA is generally detectable in upper respiratoy specimens during the acute phase of infection. The lowest concentration of SARS-CoV-2 viral copies this assay can detect is 131 copies/mL. A negative result does not preclude SARS-Cov-2 infection and should not be used as the sole basis for treatment or other patient management decisions. A negative result may occur with  improper specimen collection/handling, submission of specimen other than nasopharyngeal swab, presence of viral mutation(s) within the areas targeted by this assay, and inadequate number of viral copies (<131 copies/mL). A negative result must be combined with clinical observations, patient history, and  epidemiological information. The expected result is Negative. Fact Sheet for Patients:  PinkCheek.be Fact Sheet for Healthcare Providers:  GravelBags.it This test is not yet ap proved or cleared by the  [place] Architectural technologist and  has been authorized for detection and/or diagnosis of SARS-CoV-2 by FDA under an Print production planner (EUA). This EUA will remain  in effect (meaning this test can be used) for the duration of the COVID-19 declaration under Section 564(b)(1) of the Act, 21 U.S.C. section 360bbb-3(b)(1), unless the authorization is terminated or revoked sooner.    Influenza A by PCR NEGATIVE NEGATIVE   Influenza B by PCR NEGATIVE NEGATIVE    Comment: (NOTE) The Xpert Xpress SARS-CoV-2/FLU/RSV assay is intended as an aid in  the diagnosis of influenza from Nasopharyngeal swab specimens and  should not be used as a sole basis for treatment. Nasal washings and  aspirates are unacceptable for Xpert Xpress SARS-CoV-2/FLU/RSV  testing. Fact Sheet for Patients: PinkCheek.be Fact Sheet for Healthcare Providers: GravelBags.it This test is not yet approved or cleared by the [place] FDA and  has been authorized for detection and/or diagnosis of SARS-CoV-2 by  FDA under an Emergency Use Authorization (EUA). This EUA will remain  in effect (meaning this test can be used) for the duration of the  Covid-19 declaration under Section 564(b)(1) of the Act, 21  U.S.C. section 360bbb-3(b)(1), unless the authorization is  terminated or revoked. Performed at [facility], [address], [place], [place] [address]   . CT Soft Tissue Neck W Contrast  Result Date: 09/02/2019 CLINICAL DATA:  Facial swelling after dental anesthesia. EXAM: CT NECK WITH CONTRAST TECHNIQUE: Multidetector CT imaging of the neck was performed using the standard protocol following the bolus  administration of intravenous contrast. CONTRAST:  56mL OMNIPAQUE IOHEXOL 300 MG/ML  SOLN COMPARISON:  None. FINDINGS: Pharynx and larynx: No pharyngeal mass or edema. Airway intact. Epiglottis and larynx normal. Salivary glands: No inflammation, mass, or stone. Thyroid: Negative Lymph nodes: No enlarged lymph nodes in the neck. Vascular: Normal vascular enhancement. Limited intracranial: Negative Visualized orbits: Negative Mastoids and visualized paranasal sinuses: Negative Skeleton: Degenerative changes in the cervical spine. No acute skeletal abnormality. Upper chest: Chest CT reported separately today Other: Large amount of subcutaneous and deep soft tissue gas in the neck bilaterally. This extends into the masticator space, retropharyngeal space, and parapharyngeal space. Extensive gas extends into the mediastinum. IMPRESSION: Large amount of soft tissue gas in the neck bilaterally extending into the mediastinum. Etiology is uncertain from this study. Possible barotrauma and bleb rupture. Negative for pharyngeal mass or adenopathy.  Airway intact. Electronically Signed   By: Franchot Gallo M.D.   On: 09/02/2019 13:44   CT CHEST WO CONTRAST  Result Date: 09/02/2019 CLINICAL DATA:  Swelling following dental injection EXAM: CT CHEST WITHOUT CONTRAST TECHNIQUE: Multidetector CT imaging of the chest was performed following the standard protocol without IV contrast. COMPARISON:  None. FINDINGS: Cardiovascular: Aortic atherosclerosis. Aortic valve calcifications. Normal heart size. Scattered coronary artery calcifications. No pericardial effusion. Mediastinum/Nodes: There is extensive subcutaneous emphysema in the included lower neck, in the mediastinum, and in the pericardium. No enlarged mediastinal, hilar, or axillary lymph nodes. Thyroid gland, trachea, and esophagus demonstrate no significant findings. Lungs/Pleura: Lungs are clear. No pleural effusion or pneumothorax. Upper Abdomen: No acute abnormality.  Musculoskeletal: No chest wall mass or suspicious bone lesions identified. IMPRESSION: 1. Extensive subcutaneous emphysema in the lower neck, as well as mediastinal and pericardial emphysema without obvious etiology in the chest. 2.  No acute abnormality of the lungs.  No pneumothorax. 3.  Coronary artery disease.  Aortic atherosclerosis. Electronically Signed   By: Eddie Candle M.D.   On:  09/02/2019 13:55  .   ASSESSMENT: Extensive subcutaneous emphysema. This appears to be improving. Main concern would be risk for bacteria injected into the tissue planes along with the air, creating risk for deep tissue infection or mediastinitis.   PLAN: Continue antibiotics. I had suggested IV coverage when case discussed with ER, but currently on Augmentin. Would get thoughts from thoracic surgery on  IV coverage and how long to keep him for observation given the potential mediastinitis risk.    Riley Nearing, MD 09/02/2019 7:24 PM

## 2019-09-02 NOTE — ED Provider Notes (Signed)
[facility] Emergency Department Provider Note   ____________________________________________   First MD Initiated Contact with Patient 09/02/19 1210     (approximate)  I have reviewed the triage vital signs and the nursing notes.   HISTORY  Chief Complaint Allergic Reaction    HPI Anthony Estes is a 74 y.o. male with past medical history of hypertension presents to the ED complaining of facial swelling.  Patient reports that he was at his dentist earlier today, where he received a dental block to his left jaw and was having a dental implant examined, when he has had sudden onset swelling to the left side of his face.  He was given some p.o. Benadryl by his dentist and sent to the ED for further evaluation.  Patient denies any itching or rash, has not had any difficulty breathing, vomiting, or diarrhea.  He denies any history of allergic reactions, has received dental blocks in the past without any issues.  He initially had some pain along the left side of his neck and jaw with the swelling, however this has abated.        Past Medical History:  Diagnosis Date  . Hypertension     Patient Active Problem List   Diagnosis Date Noted  . Subcutaneous emphysema ([place]) 09/02/2019  . Facial swelling   . Essential hypertension     History reviewed. No pertinent surgical history.  Prior to Admission medications   Not on File    Allergies Tacrolimus and Other  History reviewed. No pertinent family history.  Social History Social History   Tobacco Use  . Smoking status: Not on file  Substance Use Topics  . Alcohol use: Not on file  . Drug use: Not on file    Review of Systems  Constitutional: No fever/chills Eyes: No visual changes. ENT: No sore throat.  Positive for facial pain and swelling. Cardiovascular: Denies chest pain. Respiratory: Denies shortness of breath. Gastrointestinal: No abdominal pain.  No nausea, no vomiting.  No  diarrhea.  No constipation. Genitourinary: Negative for dysuria. Musculoskeletal: Negative for back pain. Skin: Negative for rash. Neurological: Negative for headaches, focal weakness or numbness.  ____________________________________________   PHYSICAL EXAM:  VITAL SIGNS: ED Triage Vitals  Enc Vitals Group     BP 09/02/19 1153 (!) 167/89     Pulse Rate 09/02/19 1153 84     Resp 09/02/19 1153 18     Temp 09/02/19 1153 97.9 F (36.6 C)     Temp Source 09/02/19 1153 Oral     SpO2 09/02/19 1153 99 %     Weight 09/02/19 1206 155 lb (70.3 kg)     Height 09/02/19 1206 5\' 6"  (1.676 m)     Head Circumference --      Peak Flow --      Pain Score 09/02/19 1205 0     Pain Loc --      Pain Edu? --      Excl. in [place]? --     Constitutional: Alert and oriented. Eyes: Conjunctivae are normal. Head: Atraumatic.  Subcutaneous emphysema noted extending along left face up to area underneath left eye and down to his left lower neck. Nose: No congestion/rhinnorhea. Mouth/Throat: Mucous membranes are moist.  Oropharynx clear with no swelling. Neck: Normal ROM Cardiovascular: Normal rate, regular rhythm. Grossly normal heart sounds. Respiratory: Normal respiratory effort.  No retractions. Lungs CTAB. Gastrointestinal: Soft and nontender. No distention. Genitourinary: deferred Musculoskeletal: No lower extremity tenderness nor edema. Neurologic:  Normal speech and language. No gross focal neurologic deficits are appreciated. Skin:  Skin is warm, dry and intact. No rash noted. Psychiatric: Mood and affect are normal. Speech and behavior are normal.  ____________________________________________   LABS (all labs ordered are listed, but only abnormal results are displayed)  Labs Reviewed  BASIC METABOLIC PANEL - Abnormal; Notable for the following components:      Result Value   Potassium 3.1 (*)    Chloride 96 (*)    Glucose, Bld 159 (*)    Creatinine, Ser 1.31 (*)    GFR calc non Af  Amer 54 (*)    All other components within normal limits  RESPIRATORY PANEL BY RT PCR (FLU A&B, COVID)  CBC WITH DIFFERENTIAL/PLATELET  MAGNESIUM  HEMOGLOBIN A1C  ALDOSTERONE + RENIN ACTIVITY W/ RATIO    PROCEDURES  Procedure(s) performed (including Critical Care):  Procedures   ____________________________________________   INITIAL IMPRESSION / ASSESSMENT AND PLAN / ED COURSE       74 year old male presents to the ED with acute onset left-sided facial swelling along with pain while he was at his dentist office undergoing procedure.  While there was initial concern for allergic reaction, he appears to have subcutaneous emphysema along the left side of his face.  He is not having any respiratory difficulties or chest pain and I doubt pneumothorax.  He does not appear to have any oropharyngeal swelling, no signs of angioedema.  This was further assessed with CT scan, which shows extensive subcutaneous edema extending along the left face and into patient's mediastinum.  He continues to breathe comfortably with no increase in swelling.  Case discussed with Dr. Richardson Landry of ENT, who recommends starting on Unasyn and admitted to hospitalist service for observation.  Case was also discussed with Dr. Genevive Bi of cardiothoracic surgery, who will evaluate the patient.  Case discussed with hospitalist for admission.      ____________________________________________   FINAL CLINICAL IMPRESSION(S) / ED DIAGNOSES  Final diagnoses:  Subcutaneous emphysema, initial encounter [facility])  Facial swelling     ED Discharge Orders    None       Note:  This document was prepared using Dragon voice recognition software and may include unintentional dictation errors.   Blake Divine, MD 09/02/19 [id number]

## 2019-09-02 NOTE — H&P (Signed)
History and Physical    Anthony Estes [id number] DOB: [birth year] DOA: 09/02/2019  PCP: Leonel Ramsay, MD   Patient coming from: Home  I have personally briefly reviewed patient's old medical records in [employer]  Chief Complaint: Left-sided facial and neck swelling after getting injected anesthetic agent at dental office.  HPI: Anthony Estes is a 74 y.o. male with medical history significant of hypertension went to dentist office for some procedure around his existing implant.  Soon after starting the procedure he felt pain on his left side of face and neck accompanied with swelling.  Procedure was stopped, he was irrigated and given a dose of Benadryl thinking that he might have an allergic reaction to anesthetic agent.  He was sent to ED from dental office for further evaluation. Patient has multiple dental procedures in the past with no problem with anesthesia.  He denies any nausea, vomiting, chest pain or shortness of breath.  Denies any difficulty swallowing or breathing.  No respiratory symptoms.  No recent illnesses.  No fever or chills.  No diarrhea or constipation.  No urinary symptoms.  ED Course: Hemodynamically stable, labs positive for hypokalemia with potassium of 3.1 which seems chronic, CBG of 159 and creatinine of 1.31 with baseline around 1.1.  CT chest and neck with large amount of soft tissue gas in the neck extending into mediastinum possible barotrauma. ENT was consulted from ED.  Dr. Richardson Landry advised to start him on antibiotics with anaerobic coverage and keep him under observation to make sure that he continue to improve.  Review of Systems: As per HPI otherwise 10 point review of systems negative.   Past Medical History:  Diagnosis Date  . Hypertension      has no history on file for tobacco, alcohol, and drug.  Allergies  Allergen Reactions  . Tacrolimus Other (See Comments)  . Other Swelling and Rash    Septocaine with Epi 1:100,000 - caused  facial swelling and rash    History reviewed. No pertinent family history.  Prior to Admission medications   Not on File    Physical Exam: Vitals:   09/02/19 1206 09/02/19 1211 09/02/19 1230 09/02/19 1409  BP:   (!) 184/80 124/69  Pulse:   85 67  Resp:   14 15  Temp:      TempSrc:      SpO2:   99% 96%  Weight: 70.3 kg 70 kg    Height: 5\' 6"  (1.676 m) 5\' 6"  (1.676 m)      General: Vital signs reviewed.  Patient is well-developed and well-nourished, in no acute distress and cooperative with exam.  Head: Normocephalic and atraumatic. Eyes: EOMI, conjunctivae normal, no scleral icterus.  ENMT: Mucous membranes are moist.  Edema involving left side of face and left under eyes. Neck: Bilateral neck edema worse on left , trachea midline, normal ROM, no JVD, masses, thyromegaly, or carotid bruit present.  Cardiovascular: RRR, S1 normal, S2 normal, no murmurs, gallops, or rubs. Pulmonary/Chest: Clear to auscultation bilaterally, no wheezes, rales, or rhonchi. Abdominal: Soft, non-tender, non-distended, BS +,  Extremities: No lower extremity edema bilaterally,  pulses symmetric and intact bilaterally. No cyanosis or clubbing. Neurological: A&O x3, Strength is normal and symmetric bilaterally, cranial nerve II-XII are grossly intact, no focal motor deficit, sensory intact to light touch bilaterally.  Skin: Warm, dry and intact. No rashes or erythema. Psychiatric: Normal mood and affect. speech and behavior is normal. Cognition and memory are normal.  Labs on Admission: I have personally reviewed following labs and imaging studies  CBC: Recent Labs  Lab 09/02/19 1156  WBC 7.4  NEUTROABS 2.6  HGB 14.9  HCT 42.3  MCV 91.2  PLT [id number]   Basic Metabolic Panel: Recent Labs  Lab 09/02/19 1156  NA 135  K 3.1*  CL 96*  CO2 29  GLUCOSE 159*  BUN 17  CREATININE 1.31*  CALCIUM 9.2   GFR: Estimated Creatinine Clearance: 45.3 mL/min (A) (by C-G formula based on SCr of 1.31 mg/dL  (H)). Liver Function Tests: No results for input(s): AST, ALT, ALKPHOS, BILITOT, PROT, ALBUMIN in the last 168 hours. No results for input(s): LIPASE, AMYLASE in the last 168 hours. No results for input(s): AMMONIA in the last 168 hours. Coagulation Profile: No results for input(s): INR, PROTIME in the last 168 hours. Cardiac Enzymes: No results for input(s): CKTOTAL, CKMB, CKMBINDEX, TROPONINI in the last 168 hours. BNP (last 3 results) No results for input(s): PROBNP in the last 8760 hours. HbA1C: No results for input(s): HGBA1C in the last 72 hours. CBG: No results for input(s): GLUCAP in the last 168 hours. Lipid Profile: No results for input(s): CHOL, HDL, LDLCALC, TRIG, CHOLHDL, LDLDIRECT in the last 72 hours. Thyroid Function Tests: No results for input(s): TSH, T4TOTAL, FREET4, T3FREE, THYROIDAB in the last 72 hours. Anemia Panel: No results for input(s): VITAMINB12, FOLATE, FERRITIN, TIBC, IRON, RETICCTPCT in the last 72 hours. Urine analysis: No results found for: COLORURINE, APPEARANCEUR, [place], [place], GLUCOSEU, HGBUR, BILIRUBINUR, KETONESUR, PROTEINUR, UROBILINOGEN, NITRITE, LEUKOCYTESUR  Radiological Exams on Admission: CT Soft Tissue Neck W Contrast  Result Date: 09/02/2019 CLINICAL DATA:  Facial swelling after dental anesthesia. EXAM: CT NECK WITH CONTRAST TECHNIQUE: Multidetector CT imaging of the neck was performed using the standard protocol following the bolus administration of intravenous contrast. CONTRAST:  23mL OMNIPAQUE IOHEXOL 300 MG/ML  SOLN COMPARISON:  None. FINDINGS: Pharynx and larynx: No pharyngeal mass or edema. Airway intact. Epiglottis and larynx normal. Salivary glands: No inflammation, mass, or stone. Thyroid: Negative Lymph nodes: No enlarged lymph nodes in the neck. Vascular: Normal vascular enhancement. Limited intracranial: Negative Visualized orbits: Negative Mastoids and visualized paranasal sinuses: Negative Skeleton: Degenerative changes in  the cervical spine. No acute skeletal abnormality. Upper chest: Chest CT reported separately today Other: Large amount of subcutaneous and deep soft tissue gas in the neck bilaterally. This extends into the masticator space, retropharyngeal space, and parapharyngeal space. Extensive gas extends into the mediastinum. IMPRESSION: Large amount of soft tissue gas in the neck bilaterally extending into the mediastinum. Etiology is uncertain from this study. Possible barotrauma and bleb rupture. Negative for pharyngeal mass or adenopathy.  Airway intact. Electronically Signed   By: Franchot Gallo M.D.   On: 09/02/2019 13:44   CT CHEST WO CONTRAST  Result Date: 09/02/2019 CLINICAL DATA:  Swelling following dental injection EXAM: CT CHEST WITHOUT CONTRAST TECHNIQUE: Multidetector CT imaging of the chest was performed following the standard protocol without IV contrast. COMPARISON:  None. FINDINGS: Cardiovascular: Aortic atherosclerosis. Aortic valve calcifications. Normal heart size. Scattered coronary artery calcifications. No pericardial effusion. Mediastinum/Nodes: There is extensive subcutaneous emphysema in the included lower neck, in the mediastinum, and in the pericardium. No enlarged mediastinal, hilar, or axillary lymph nodes. Thyroid gland, trachea, and esophagus demonstrate no significant findings. Lungs/Pleura: Lungs are clear. No pleural effusion or pneumothorax. Upper Abdomen: No acute abnormality. Musculoskeletal: No chest wall mass or suspicious bone lesions identified. IMPRESSION: 1. Extensive subcutaneous emphysema in the lower neck, as  well as mediastinal and pericardial emphysema without obvious etiology in the chest. 2.  No acute abnormality of the lungs.  No pneumothorax. 3.  Coronary artery disease.  Aortic atherosclerosis. Electronically Signed   By: Eddie Candle M.D.   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  ENT was consulted from ED and they were recommending observation overnight and antibiotics with anaerobic coverage.  He was given 1 dose of Unasyn in ED. -Start him on Augmentin. -Admit him under observation in [place].  Hypertension.  Per patient he takes HCTZ and metoprolol twice daily for his hypertension.  Waiting for med rec. -We will continue home meds once med rec done. -Hydralazine as needed.  Hypokalemia.  And has potassium of 3.1.  Seems chronic.  Can be due to HCTZ. Because of his history of hypertension with hypokalemia we will check aldosterone and renin ratio. -Replace potassium. -Check magnesium.  Elevated blood glucose.  Blood glucose levels are elevated at 159 with no prior diagnosis of diabetes.  Family history of diabetes. -Check A1c.  DVT prophylaxis: Lovenox Code Status: Full code Family Communication: Discussed with patient. Disposition Plan: Patient will go back home. Consults called: ED provider discussed with ENT on phone. Admission status: Observation   Lorella Nimrod MD [facility]  If 7PM-7AM, please contact night-coverage [email or web address]  09/02/2019, 3:03 PM   This record has been created using Systems analyst. Errors have been sought and corrected,but may not always be located. Such creation errors do not reflect on the standard of care.

## 2019-09-02 NOTE — ED Triage Notes (Signed)
Pt presents to ED c/o allergic reaction after injected anesthetic at dentist's office around 1110. Swelling noted to L side of pt's face extending into neck. Pt denies SOB but states it's a little difficult to swallow. Pt given 25mg  PO Benadryl at dentist's office.

## 2019-09-03 DIAGNOSIS — R220 Localized swelling, mass and lump, head: Secondary | ICD-10-CM | POA: Diagnosis not present

## 2019-09-03 DIAGNOSIS — T797XXA Traumatic subcutaneous emphysema, initial encounter: Secondary | ICD-10-CM | POA: Diagnosis not present

## 2019-09-03 LAB — GLUCOSE, CAPILLARY: Glucose-Capillary: 116 mg/dL — ABNORMAL HIGH (ref 70–99)

## 2019-09-03 LAB — BASIC METABOLIC PANEL
Anion gap: 8 (ref 5–15)
BUN: 14 mg/dL (ref 8–23)
CO2: 28 mmol/L (ref 22–32)
Calcium: 8.9 mg/dL (ref 8.9–10.3)
Chloride: 103 mmol/L (ref 98–111)
Creatinine, Ser: 1.13 mg/dL (ref 0.61–1.24)
GFR calc Af Amer: >60 mL/min (ref >60)
GFR calc non Af Amer: >60 mL/min (ref >60)
Glucose, Bld: 111 mg/dL — ABNORMAL HIGH (ref 70–99)
Potassium: 3.4 mmol/L — ABNORMAL LOW (ref 3.5–5.1)
Sodium: 139 mmol/L (ref 135–145)

## 2019-09-03 MED ORDER — AMOXICILLIN-POT CLAVULANATE 875-125 MG PO TABS: 1.0000 | ORAL_TABLET | Freq: Two times a day (BID) | ORAL | 0 refills | Status: AC

## 2019-09-03 MED ORDER — POTASSIUM CHLORIDE CRYS ER 20 MEQ PO TBCR
40.0000 meq | EXTENDED_RELEASE_TABLET | Freq: Once | ORAL | Status: AC
  Administered 2019-09-03: 40 meq via ORAL
  Filled 2019-09-03: qty 2

## 2019-09-03 NOTE — Final Consult Note (Signed)
Patient ID: Anthony Estes, male   DOB: 1945/11/25, 74 y.o.   MRN: [id number]  Chief Complaint  Patient presents with  . Allergic Reaction    Referred By Dr. Imelda Pillow Reason for Referral mediastinal air  HPI Location, Quality, Duration, Severity, Timing, Context, Modifying Factors, Associated Signs and Symptoms.  Anthony Estes is a 74 y.o. male.  This patient was seen last night in the emergency room when he was admitted with neck and mediastinal subcutaneous emphysema.  He was at the dentist office where a procedure is being performed using an air drill.  He developed acute onset of pain and swelling on the side of his face and it was felt that he may have had an allergic reaction to the local anesthetic.  He was given some Benadryl and the patient was then brought here to the emergency department.  Once he was here he began noticing swelling in the side of his face particularly over his left eyelid.  He had a neck CT and the chest CT.  This revealed extensive mediastinal emphysema and neck emphysema.  It was elected to admit the patient to the hospital for continued observation.  The patient states that he has mostly pain on the right side of his neck and swelling on his left eyelid.   Past Medical History:  Diagnosis Date  . Hypertension     History reviewed. No pertinent surgical history.  History reviewed. No pertinent family history.  Social History Social History   Tobacco Use  . Smoking status: Never Smoker  . Smokeless tobacco: Never Used  Substance Use Topics  . Alcohol use: Not Currently  . Drug use: Never    Allergies  Allergen Reactions  . Tacrolimus Other (See Comments)  . Other Swelling and Rash    Septocaine with Epi 1:100,000 - caused facial swelling and rash    Current Facility-Administered Medications  Medication Dose Route Frequency Provider Last Rate Last Admin  . acetaminophen (TYLENOL) tablet 650 mg  650 mg Oral Q6H PRN Lorella Nimrod, MD       Or   . acetaminophen (TYLENOL) suppository 650 mg  650 mg Rectal Q6H PRN Lorella Nimrod, MD      . amoxicillin-clavulanate (AUGMENTIN) 875-125 MG per tablet 1 tablet  1 tablet Oral Q12H Lorella Nimrod, MD   1 tablet at 09/02/19 2129  . docusate sodium (COLACE) capsule 100 mg  100 mg Oral QHS Amin, Soundra Pilon, MD      . enoxaparin (LOVENOX) injection 40 mg  40 mg Subcutaneous Q24H Lorella Nimrod, MD   40 mg at 09/02/19 2130  . hydrochlorothiazide (HYDRODIURIL) tablet 25 mg  25 mg Oral Daily Lorella Nimrod, MD      . metoprolol tartrate (LOPRESSOR) tablet 25 mg  25 mg Oral BID Lorella Nimrod, MD   25 mg at 09/02/19 2129  . potassium chloride SA (KLOR-CON) CR tablet 40 mEq  40 mEq Oral Once Lorella Nimrod, MD      . simvastatin (ZOCOR) tablet 20 mg  20 mg Oral QHS Lorella Nimrod, MD   20 mg at 09/02/19 2130  . 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  He also has several dental  issues that are being addressed.  Blood pressure 139/80, pulse 75, temperature 98.3 F (36.8 C), temperature source Oral, resp. rate 16, height 5\' 6"  (1.676 m), weight 70 kg, SpO2 98 %.  Physical Exam CONSTITUTIONAL:  Pleasant, well-developed, well-nourished, and in no acute distress. EYES: Pupils equal and reactive to light, Sclera non-icteric EARS, NOSE, MOUTH AND THROAT:  The oropharynx was clear.  Dentition is good repair.  Oral mucosa pink and moist. LYMPH NODES:  Lymph nodes in the neck and axillae were normal RESPIRATORY:  Lungs were clear.  Normal respiratory effort without pathologic use of accessory muscles of respiration CARDIOVASCULAR: Heart was regular without murmurs.  There were no carotid bruits. GI: The abdomen was soft, nontender, and nondistended. There were no  palpable masses. There was no hepatosplenomegaly. There were normal bowel sounds in all quadrants. GU:  Rectal deferred.   MUSCULOSKELETAL:  Normal muscle strength and tone.  No clubbing or cyanosis.   SKIN:  There were no pathologic skin lesions.  There were no nodules on palpation.  There is swelling particularly of the left lower eyelid.  There is palpable subcutaneous emphysema throughout the neck. NEUROLOGIC:  Sensation is normal.  Cranial nerves are grossly intact. PSYCH:  Oriented to person, place and time.  Mood and affect are normal.  Data Reviewed Chest CT  I have personally reviewed the patient's imaging, laboratory findings and medical records.    Assessment    Mediastinal emphysema following dental procedure    Plan    I agree with the plan to admit the patient to the hospital for ongoing observation and for intravenous antibiotics.  I do not think that he has anything more ominous than air related to the surgical procedure.  I do not think he has pathology associated with the esophagus or tracheobronchial tree.       Nestor Lewandowsky, MD 09/03/2019, 8:33 AM

## 2019-09-03 NOTE — Care Management Important Message (Deleted)
Important Message  Patient Details  Name: Anthony Estes MRN: [id number] Date of Birth: [birth year]   Medicare Important Message Given:  Yes     Juliann Pulse A [name] 09/03/2019, 10:22 AM

## 2019-09-03 NOTE — Discharge Summary (Signed)
Physician Discharge Summary  Anthony Estes [id number] DOB: December 26, 1945 DOA: 09/02/2019  PCP: Anthony Ramsay, MD  Admit date: 09/02/2019 Discharge date: 09/03/2019  Admitted From: Home Disposition:  Home  Recommendations for Outpatient Follow-up:  1. Follow up with PCP in 1-2 weeks 2. Follow-up with ENT in 1 week 3. Please obtain BMP/CBC in one week 4. Please follow up on the following pending results: Aldosterone/renin ratio  Home Health: No Equipment/Devices: None Discharge Condition: Stable CODE STATUS: Full Diet recommendation: Heart Healthy    Brief/Interim Summary: Anthony Estes is a 74 y.o. male with medical history significant of hypertension went to dentist office for some procedure around his existing implant.  Soon after starting the procedure he felt pain on his left side of face and neck accompanied with swelling.  Procedure was stopped, he was irrigated and given a dose of Benadryl thinking that he might have an allergic reaction to anesthetic agent.  He was sent to ED from dental office for further evaluation. He was hemodynamically stable with stable labs which are only positive for hypokalemia and mildly elevated blood sugar at 159.  CT chest and neck with large amount of soft tissue gas in the neck extending into the mediastinum.  He was admitted under observation for subcutaneous emphysema. He was initially given Unasyn for concern of mediastinitis and discharged on Augmentin for 10 days to prevent mediastinitis and he will follow-up with Dr. Richardson Landry from ENT in 1 week.  His swelling improved the next day.  He was instructed to seek medical attention if develops fever, chest pain or shortness of breath. ENT and cardiothoracic surgery both evaluated him during current hospitalization.  And advise antibiotics for 10 days.  Patient has an history of hypertension and hypokalemia seems chronic.  Might be due to HCTZ.  We draw blood from aldosterone/renal ratio, results are  pending and his PCP should be able to access results for further intervention.  A1c was done due to elevated CBG which was 5.5.  He will continue his home meds and will follow up with PCP for further management.  Discharge Diagnoses:  Active Problems:   Subcutaneous emphysema [facility])  Discharge Instructions  Discharge Instructions    Diet - low sodium heart healthy   Complete by: As directed    Discharge instructions   Complete by: As directed    It was pleasure taking care of you. I am giving you antibiotics for 10 days, please take it as directed. You need to follow-up with ENT, Dr. Richardson Landry in 1 week. Please seek medical attention if you develop fever, chest pain or shortness of breath.   Increase activity slowly   Complete by: As directed      Allergies as of 09/03/2019      Reactions   Tacrolimus Other (See Comments)   Other Swelling, Rash   Septocaine with Epi 1:100,000 - caused facial swelling and rash      Medication List    TAKE these medications   amoxicillin-clavulanate 875-125 MG tablet Commonly known as: AUGMENTIN Take 1 tablet by mouth every 12 (twelve) hours for 10 days.   docusate sodium 100 MG capsule Commonly known as: COLACE Take 100 mg by mouth at bedtime.   doxycycline 50 MG capsule Commonly known as: MONODOX Take 50 mg by mouth daily.   hydrochlorothiazide 25 MG tablet Commonly known as: HYDRODIURIL Take 25 mg by mouth daily.   metoprolol tartrate 25 MG tablet Commonly known as: LOPRESSOR Take 25 mg by  mouth 2 (two) times daily.   multivitamin with minerals Tabs tablet Take 1 tablet by mouth daily.   omega-3 acid ethyl esters 1 g capsule Commonly known as: LOVAZA Take 1 g by mouth 2 (two) times daily.   simvastatin 40 MG tablet Commonly known as: ZOCOR Take 20 mg by mouth at bedtime.      Follow-up Information    Anthony Ramsay, MD. Schedule an appointment as soon as possible for a visit.   Specialty: Infectious  Diseases Contact information: [place] [address] [phone]        Clyde Canterbury, MD. Schedule an appointment as soon as possible for a visit.   Specialty: Otolaryngology Why: To be seen in 1 week Contact information: [address] [place]  [address] [phone]          Allergies  Allergen Reactions  . Tacrolimus Other (See Comments)  . Other Swelling and Rash    Septocaine with Epi 1:100,000 - caused facial swelling and rash    Consultations:  ENT  Thoracic surgery  Procedures/Studies: CT Soft Tissue Neck W Contrast  Result Date: 09/02/2019 CLINICAL DATA:  Facial swelling after dental anesthesia. EXAM: CT NECK WITH CONTRAST TECHNIQUE: Multidetector CT imaging of the neck was performed using the standard protocol following the bolus administration of intravenous contrast. CONTRAST:  63mL OMNIPAQUE IOHEXOL 300 MG/ML  SOLN COMPARISON:  None. FINDINGS: Pharynx and larynx: No pharyngeal mass or edema. Airway intact. Epiglottis and larynx normal. Salivary glands: No inflammation, mass, or stone. Thyroid: Negative Lymph nodes: No enlarged lymph nodes in the neck. Vascular: Normal vascular enhancement. Limited intracranial: Negative Visualized orbits: Negative Mastoids and visualized paranasal sinuses: Negative Skeleton: Degenerative changes in the cervical spine. No acute skeletal abnormality. Upper chest: Chest CT reported separately today Other: Large amount of subcutaneous and deep soft tissue gas in the neck bilaterally. This extends into the masticator space, retropharyngeal space, and parapharyngeal space. Extensive gas extends into the mediastinum. IMPRESSION: Large amount of soft tissue gas in the neck bilaterally extending into the mediastinum. Etiology is uncertain from this study. Possible barotrauma and bleb rupture. Negative for pharyngeal mass or adenopathy.  Airway intact. Electronically Signed   By: Franchot Gallo  M.D.   On: 09/02/2019 13:44   CT CHEST WO CONTRAST  Result Date: 09/02/2019 CLINICAL DATA:  Swelling following dental injection EXAM: CT CHEST WITHOUT CONTRAST TECHNIQUE: Multidetector CT imaging of the chest was performed following the standard protocol without IV contrast. COMPARISON:  None. FINDINGS: Cardiovascular: Aortic atherosclerosis. Aortic valve calcifications. Normal heart size. Scattered coronary artery calcifications. No pericardial effusion. Mediastinum/Nodes: There is extensive subcutaneous emphysema in the included lower neck, in the mediastinum, and in the pericardium. No enlarged mediastinal, hilar, or axillary lymph nodes. Thyroid gland, trachea, and esophagus demonstrate no significant findings. Lungs/Pleura: Lungs are clear. No pleural effusion or pneumothorax. Upper Abdomen: No acute abnormality. Musculoskeletal: No chest wall mass or suspicious bone lesions identified. IMPRESSION: 1. Extensive subcutaneous emphysema in the lower neck, as well as mediastinal and pericardial emphysema without obvious etiology in the chest. 2.  No acute abnormality of the lungs.  No pneumothorax. 3.  Coronary artery disease.  Aortic atherosclerosis. Electronically Signed   By: Eddie Candle M.D.   On: 09/02/2019 13:55     Subjective: Patient was feeling better when seen this morning.  Swelling seems improving.  Still having some pain along the left side of the neck.  Denies any chest pain or shortness  of breath.  He really wants to go back home.  Discharge Exam: Vitals:   09/03/19 1000 09/03/19 1022  BP: 135/71 135/71  Pulse: 68 68  Resp: 18   Temp:    SpO2: 99% 99%   Vitals:   09/03/19 0502 09/03/19 0744 09/03/19 1000 09/03/19 1022  BP: 126/77 139/80 135/71 135/71  Pulse: 72 75 68 68  Resp: 18 16 18    Temp: 98.6 F (37 C) 98.3 F (36.8 C)    TempSrc: Oral Oral    SpO2: 95% 98% 99% 99%  Weight:      Height:        General: Pt is alert, awake, not in acute distress.  Mild edema  involving left side of the face and neck, improved as compared to admission. Cardiovascular: RRR, S1/S2 +, no rubs, no gallops Respiratory: CTA bilaterally, no wheezing, no rhonchi Abdominal: Soft, NT, ND, bowel sounds + Extremities: no edema, no cyanosis   The results of significant diagnostics from this hospitalization (including imaging, microbiology, ancillary and laboratory) are listed below for reference.    Microbiology: Recent Results (from the past 240 hour(s))  Respiratory Panel by RT PCR (Flu A&B, Covid) - Nasopharyngeal Swab     Status: None   Collection Time: 09/02/19  1:49 PM   Specimen: Nasopharyngeal Swab  Result Value Ref Range Status   SARS Coronavirus 2 by RT PCR NEGATIVE NEGATIVE Final    Comment: (NOTE) SARS-CoV-2 target nucleic acids are NOT DETECTED. The SARS-CoV-2 RNA is generally detectable in upper respiratoy specimens during the acute phase of infection. The lowest concentration of SARS-CoV-2 viral copies this assay can detect is 131 copies/mL. A negative result does not preclude SARS-Cov-2 infection and should not be used as the sole basis for treatment or other patient management decisions. A negative result may occur with  improper specimen collection/handling, submission of specimen other than nasopharyngeal swab, presence of viral mutation(s) within the areas targeted by this assay, and inadequate number of viral copies (<131 copies/mL). A negative result must be combined with clinical observations, patient history, and epidemiological information. The expected result is Negative. Fact Sheet for Patients:  PinkCheek.be Fact Sheet for Healthcare Providers:  GravelBags.it This test is not yet ap proved or cleared by the [place] FDA and  has been authorized for detection and/or diagnosis of SARS-CoV-2 by FDA under an Emergency Use Authorization (EUA). This EUA will remain  in effect  (meaning this test can be used) for the duration of the COVID-19 declaration under Section 564(b)(1) of the Act, 21 U.S.C. section 360bbb-3(b)(1), unless the authorization is terminated or revoked sooner.    Influenza A by PCR NEGATIVE NEGATIVE Final   Influenza B by PCR NEGATIVE NEGATIVE Final    Comment: (NOTE) The Xpert Xpress SARS-CoV-2/FLU/RSV assay is intended as an aid in  the diagnosis of influenza from Nasopharyngeal swab specimens and  should not be used as a sole basis for treatment. Nasal washings and  aspirates are unacceptable for Xpert Xpress SARS-CoV-2/FLU/RSV  testing. Fact Sheet for Patients: PinkCheek.be Fact Sheet for Healthcare Providers: GravelBags.it This test is not yet approved or cleared by the [place] FDA and  has been authorized for detection and/or diagnosis of SARS-CoV-2 by  FDA under an Emergency Use Authorization (EUA). This EUA will remain  in effect (meaning this test can be used) for the duration of the  Covid-19 declaration under Section 564(b)(1) of the Act, 21  U.S.C. section 360bbb-3(b)(1), unless the authorization is  terminated  or revoked. Performed at [facility], [address] [place], [place] [address]      Labs: BNP (last 3 results) No results for input(s): BNP in the last 8760 hours. Basic Metabolic Panel: Recent Labs  Lab 09/02/19 1156 09/03/19 0518  NA 135 139  K 3.1* 3.4*  CL 96* 103  CO2 29 28  GLUCOSE 159* 111*  BUN 17 14  CREATININE 1.31* 1.13  CALCIUM 9.2 8.9  MG 2.2  --    Liver Function Tests: No results for input(s): AST, ALT, ALKPHOS, BILITOT, PROT, ALBUMIN in the last 168 hours. No results for input(s): LIPASE, AMYLASE in the last 168 hours. No results for input(s): AMMONIA in the last 168 hours. CBC: Recent Labs  Lab 09/02/19 1156  WBC 7.4  NEUTROABS 2.6  HGB 14.9  HCT 42.3  MCV 91.2  PLT 157   Cardiac Enzymes: No results  for input(s): CKTOTAL, CKMB, CKMBINDEX, TROPONINI in the last 168 hours. BNP: Invalid input(s): POCBNP CBG: No results for input(s): GLUCAP in the last 168 hours. D-Dimer No results for input(s): DDIMER in the last 72 hours. Hgb A1c Recent Labs    09/02/19 1156  HGBA1C 5.5   Lipid Profile No results for input(s): CHOL, HDL, LDLCALC, TRIG, CHOLHDL, LDLDIRECT in the last 72 hours. Thyroid function studies No results for input(s): TSH, T4TOTAL, T3FREE, THYROIDAB in the last 72 hours.  Invalid input(s): FREET3 Anemia work up No results for input(s): VITAMINB12, FOLATE, FERRITIN, TIBC, IRON, RETICCTPCT in the last 72 hours. Urinalysis No results found for: COLORURINE, APPEARANCEUR, [place], [place], [place], [place], [place], [place], PROTEINUR, UROBILINOGEN, NITRITE, LEUKOCYTESUR Sepsis Labs Invalid input(s): PROCALCITONIN,  WBC,  LACTICIDVEN Microbiology Recent Results (from the past 240 hour(s))  Respiratory Panel by RT PCR (Flu A&B, Covid) - Nasopharyngeal Swab     Status: None   Collection Time: 09/02/19  1:49 PM   Specimen: Nasopharyngeal Swab  Result Value Ref Range Status   SARS Coronavirus 2 by RT PCR NEGATIVE NEGATIVE Final    Comment: (NOTE) SARS-CoV-2 target nucleic acids are NOT DETECTED. The SARS-CoV-2 RNA is generally detectable in upper respiratoy specimens during the acute phase of infection. The lowest concentration of SARS-CoV-2 viral copies this assay can detect is 131 copies/mL. A negative result does not preclude SARS-Cov-2 infection and should not be used as the sole basis for treatment or other patient management decisions. A negative result may occur with  improper specimen collection/handling, submission of specimen other than nasopharyngeal swab, presence of viral mutation(s) within the areas targeted by this assay, and inadequate number of viral copies (<131 copies/mL). A negative result must be combined with clinical observations, patient  history, and epidemiological information. The expected result is Negative. Fact Sheet for Patients:  PinkCheek.be Fact Sheet for Healthcare Providers:  GravelBags.it This test is not yet ap proved or cleared by the [place] FDA and  has been authorized for detection and/or diagnosis of SARS-CoV-2 by FDA under an Emergency Use Authorization (EUA). This EUA will remain  in effect (meaning this test can be used) for the duration of the COVID-19 declaration under Section 564(b)(1) of the Act, 21 U.S.C. section 360bbb-3(b)(1), unless the authorization is terminated or revoked sooner.    Influenza A by PCR NEGATIVE NEGATIVE Final   Influenza B by PCR NEGATIVE NEGATIVE Final    Comment: (NOTE) The Xpert Xpress SARS-CoV-2/FLU/RSV assay is intended as an aid in  the diagnosis of influenza from Nasopharyngeal swab specimens and  should not be used  as a sole basis for treatment. Nasal washings and  aspirates are unacceptable for Xpert Xpress SARS-CoV-2/FLU/RSV  testing. Fact Sheet for Patients: PinkCheek.be Fact Sheet for Healthcare Providers: GravelBags.it This test is not yet approved or cleared by the [place] FDA and  has been authorized for detection and/or diagnosis of SARS-CoV-2 by  FDA under an Emergency Use Authorization (EUA). This EUA will remain  in effect (meaning this test can be used) for the duration of the  Covid-19 declaration under Section 564(b)(1) of the Act, 21  U.S.C. section 360bbb-3(b)(1), unless the authorization is  terminated or revoked. Performed at [facility], [address] [place], [place] [address]     Time coordinating discharge: Over 30 minutes  SIGNED:  Lorella Nimrod, MD  [facility] 09/03/2019, 10:32 AM  If 7PM-7AM, please contact night-coverage [email or web address]  This record has been created using Actor. Errors have been sought and corrected,but may not always be located. Such creation errors do not reflect on the standard of care.

## 2019-09-03 NOTE — Progress Notes (Signed)
Discharge Note: Reviewed Med list/Discharge instructions. Pt and wife verbalized understanding.  Obtained vitals. Staff wheeled Pt out.  Transported via private vehicle to home.

## 2019-09-07 LAB — ALDOSTERONE + RENIN ACTIVITY W/ RATIO
ALDO / PRA Ratio: 0.7 (ref 0.0–30.0)
Aldosterone: 1 ng/dL (ref 0.0–30.0)
PRA LC/MS/MS: 1.357 ng/mL/hr (ref 0.167–5.380)

## 2020-06-07 ENCOUNTER — Other Ambulatory Visit: Payer: Self-pay | Admitting: Internal Medicine

## 2020-06-07 ENCOUNTER — Ambulatory Visit: Payer: Medicare Other | Attending: Internal Medicine

## 2020-06-07 DIAGNOSIS — Z23 Encounter for immunization: Secondary | ICD-10-CM

## 2020-06-07 NOTE — Progress Notes (Signed)
   Covid-19 Vaccination Clinic  Name:  Anthony Estes    MRN: [id number] DOB: 1945/08/21  06/07/2020  [name] was observed post Covid-19 immunization for 15 minutes without incident. He was provided with Vaccine Information Sheet and instruction to access the V-Safe system.   [name] was instructed to call 911 with any severe reactions post vaccine: Marland Kitchen Difficulty breathing  . Swelling of face and throat  . A fast heartbeat  . A bad rash all over body  . Dizziness and weakness   Immunizations Administered    Name Date Dose VIS Date Route   PFIZER Comrnaty(Gray TOP) Covid-19 Vaccine 06/07/2020 11:07 AM 0.3 mL 04/07/2020 Intramuscular   Manufacturer: [employer]   Lot: [id number]   NDC: [id number]

## 2020-08-23 ENCOUNTER — Encounter (INDEPENDENT_AMBULATORY_CARE_PROVIDER_SITE_OTHER): Payer: Medicare Other | Admitting: Ophthalmology

## 2020-08-23 ENCOUNTER — Other Ambulatory Visit: Payer: Self-pay

## 2020-08-23 DIAGNOSIS — H35341 Macular cyst, hole, or pseudohole, right eye: Secondary | ICD-10-CM | POA: Diagnosis not present

## 2020-08-23 DIAGNOSIS — I10 Essential (primary) hypertension: Secondary | ICD-10-CM | POA: Diagnosis not present

## 2020-08-23 DIAGNOSIS — H35033 Hypertensive retinopathy, bilateral: Secondary | ICD-10-CM | POA: Diagnosis not present

## 2020-08-23 DIAGNOSIS — H43813 Vitreous degeneration, bilateral: Secondary | ICD-10-CM

## 2020-08-23 DIAGNOSIS — D3132 Benign neoplasm of left choroid: Secondary | ICD-10-CM

## 2020-08-23 DIAGNOSIS — H43821 Vitreomacular adhesion, right eye: Secondary | ICD-10-CM | POA: Diagnosis not present

## 2020-10-19 IMAGING — MR MR LUMBAR SPINE WO/W CM
6 of 7 series · 31 of 48 positions shown · IV contrast (gadavist)
Comparison: None.

CLINICAL DATA: Chronic low back pain.  No known injury.

EXAM:
MRI LUMBAR SPINE WITHOUT AND WITH CONTRAST
TECHNIQUE: Multiplanar and multiecho pulse sequences of the lumbar spine were
obtained without and with intravenous contrast.
CONTRAST:  6 mL GADAVIST IV

[Series 5: T2 · sagittal · 4.0mm · 0.81mm/px · 4 of 17 slices shown (1 of 2)]
[im 1/17]
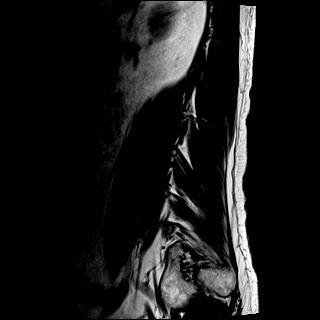
[im 6/17]
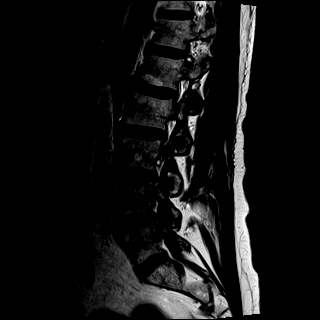
[im 11/17]
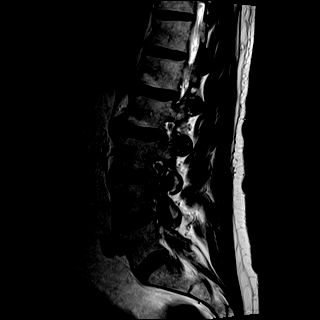
[im 17/17]
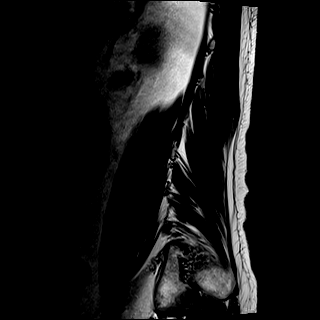

[Series 6: T1 · sagittal · 4.0mm · 0.81mm/px · 4 of 17 slices shown (1 of 2)]
[im 1/17]
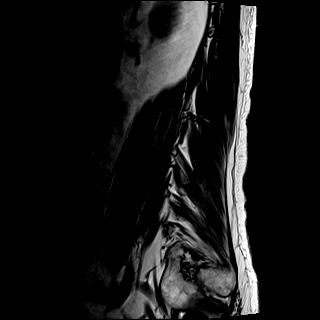
[im 6/17]
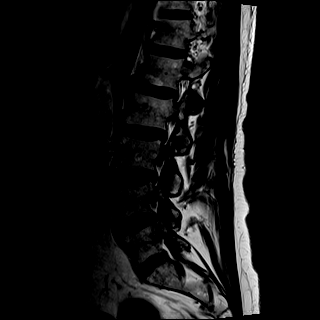
[im 11/17]
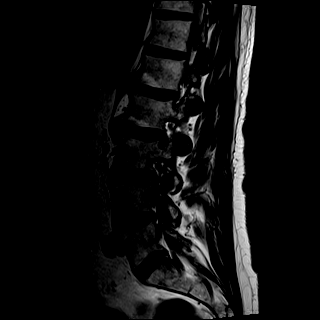
[im 17/17]
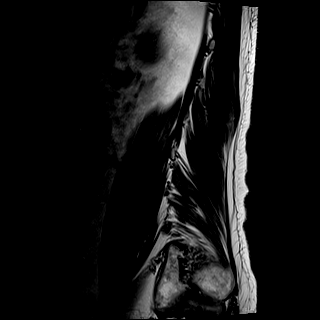

[Series 7: STIR · sagittal · 4.0mm · 0.41mm/px · 2 of 17 slices shown]
[im 1/17]
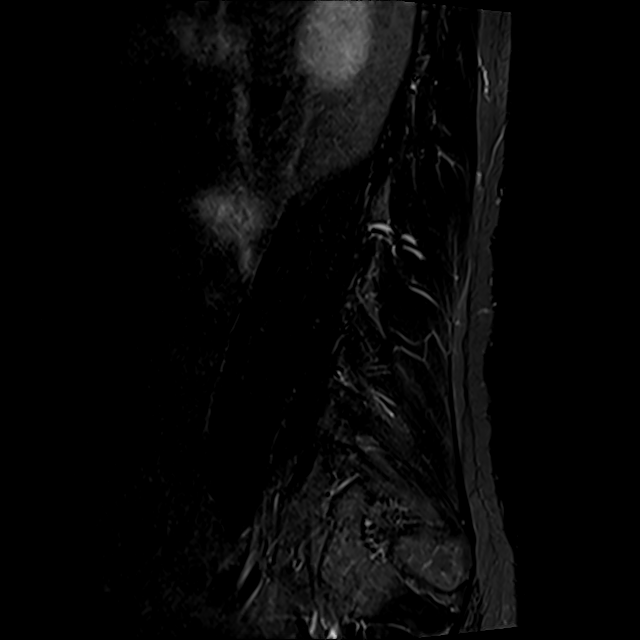
[im 5/17]
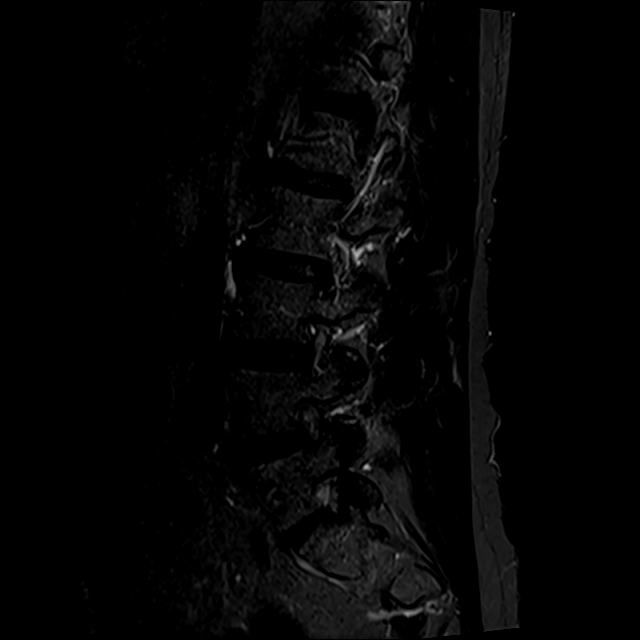

[Series 8: T2 · axial · 4.0mm · 0.78mm/px · z∈[-111,+104]mm · 8 of 37 slices shown (2 of 2)]
[im 1/37]
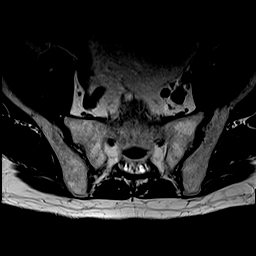
[im 5/37]
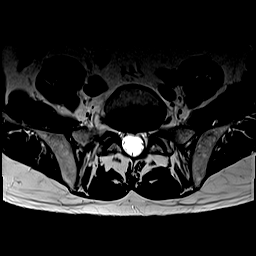
[im 13/37]
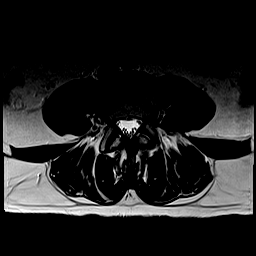
[im 17/37]
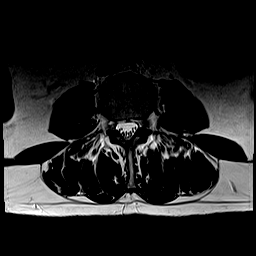
[im 21/37]
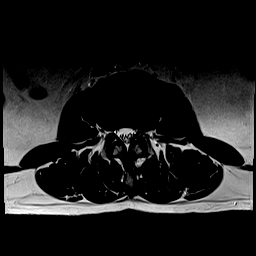
[im 25/37]
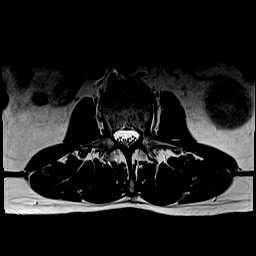
[im 33/37]
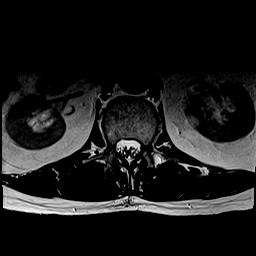
[im 37/37]
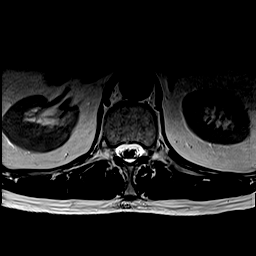

[Series 9: T1 · axial · 4.0mm · 0.39mm/px · z∈[-111,+104]mm · 8 of 37 slices shown (2 of 2)]
[im 1/37]
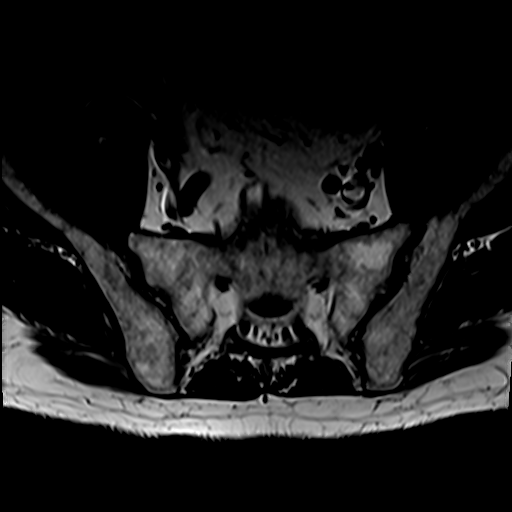
[im 5/37]
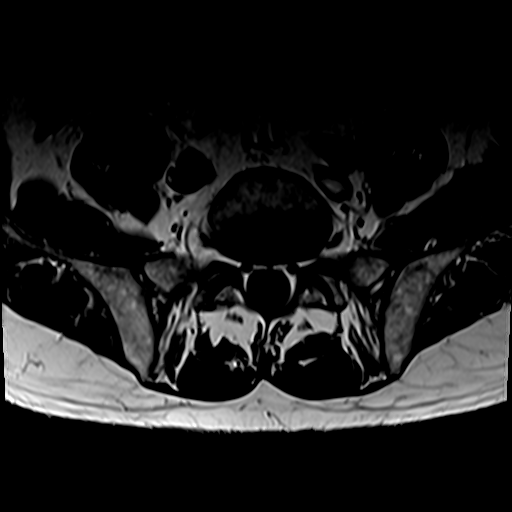
[im 13/37]
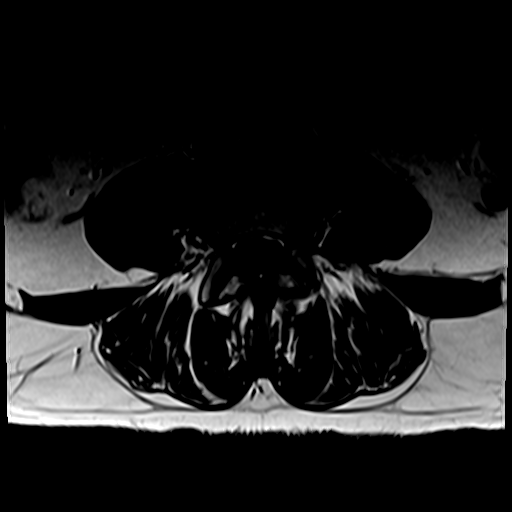
[im 17/37]
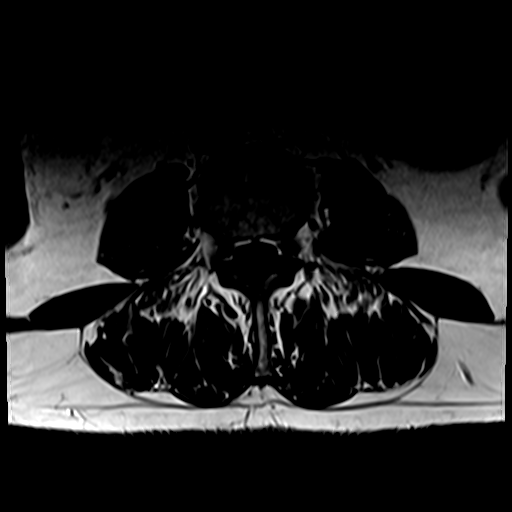
[im 21/37]
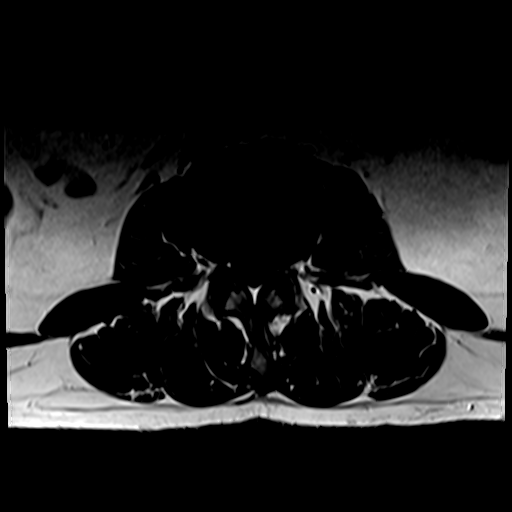
[im 25/37]
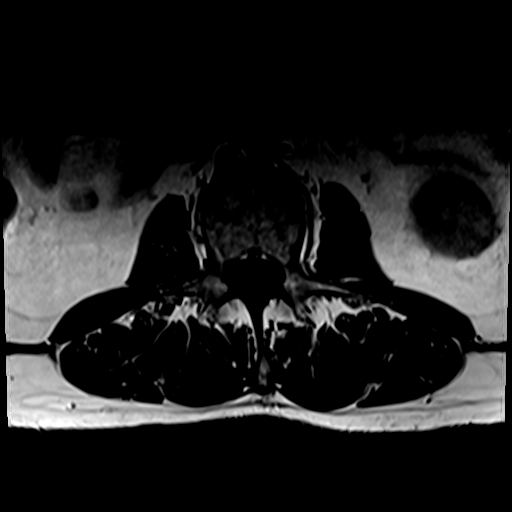
[im 33/37]
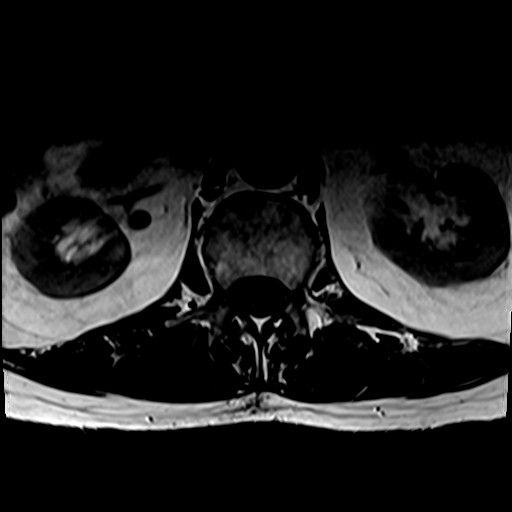
[im 37/37]
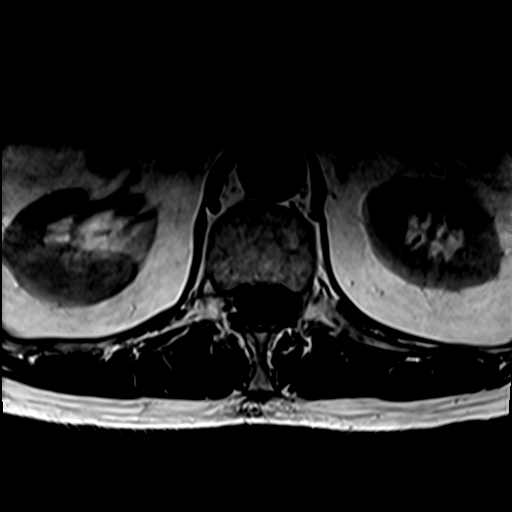

[Series 10: T1 fat-sat post-contrast · sagittal · 4.0mm · 0.81mm/px · 5 of 17 slices shown]
[im 1/17]
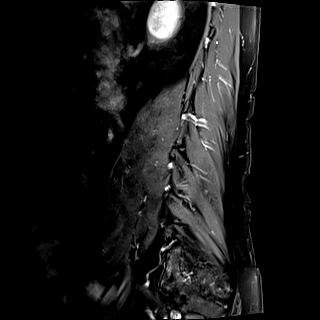
[im 5/17]
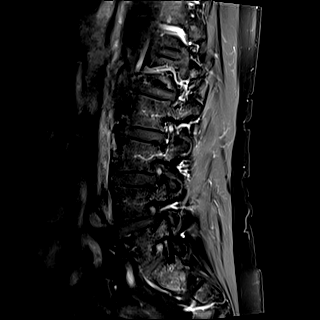
[im 9/17]
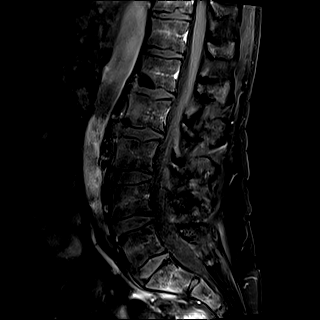
[im 13/17]
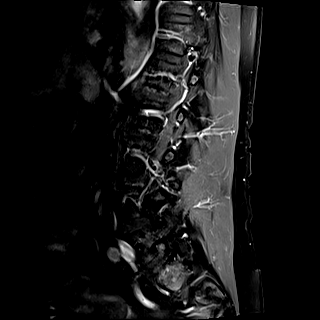
[im 17/17]
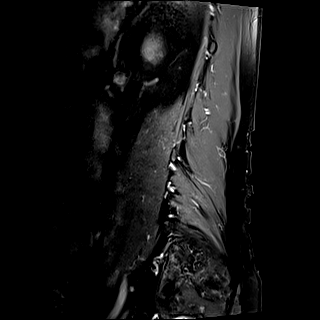

[31 of 48 positions shown; findings below may reference images not displayed]

FINDINGS: Segmentation:  Standard.

Alignment:  Normal.

Vertebrae: No fracture, evidence of discitis, or bone lesion. A few
small Schmorl's nodes are noted.

Conus medullaris and cauda equina: Conus extends to the L1 level.
Conus and cauda equina appear normal.

Paraspinal and other soft tissues: Small left renal cyst is
incidentally noted.

Disc levels:

T11-12 is imaged in the sagittal plane only and negative.

T12-L1: Negative.

L1-2: Minimal disc bulge and mild facet degenerative disease. No
stenosis.

L2-3: Minimal disc bulge.  No stenosis.

L3-4: Shallow disc bulge and a small left foraminal protrusion. The
left foramen is widely patent but disc contacts the exiting left L3
root. The central canal and right foramen are open.

L4-5: Mild central canal narrowing due to a shallow disc bulge and
ligamentum flavum thickening. The neural foramina are open.

L5-S1: Negative.
IMPRESSION: No acute or focal abnormality.

Mild lumbar spondylosis most notable at L4-5 where there is mild
central canal narrowing due to a shallow disc bulge and ligamentum
flavum thickening.

Small left foraminal protrusion at L3-4 contacts the exiting left L3
root but the foramen is widely patent.

## 2021-02-22 ENCOUNTER — Encounter (INDEPENDENT_AMBULATORY_CARE_PROVIDER_SITE_OTHER): Payer: Medicare Other | Admitting: Ophthalmology

## 2021-10-11 ENCOUNTER — Other Ambulatory Visit: Payer: Self-pay | Admitting: Family Medicine

## 2021-10-11 DIAGNOSIS — I739 Peripheral vascular disease, unspecified: Secondary | ICD-10-CM

## 2021-10-17 ENCOUNTER — Ambulatory Visit
Admission: RE | Admit: 2021-10-17 | Discharge: 2021-10-17 | Disposition: A | Discharge status: Home / Self Care | Payer: Medicare Other | Source: Ambulatory Visit | Attending: Family Medicine | Admitting: Family Medicine

## 2021-10-17 ENCOUNTER — Other Ambulatory Visit: Payer: Self-pay | Admitting: Family Medicine

## 2021-10-17 DIAGNOSIS — I739 Peripheral vascular disease, unspecified: Secondary | ICD-10-CM
